# Patient Record
Sex: Male | Born: 1990 | Race: White | Hispanic: No | Marital: Single | Smoking: Current every day smoker
Health system: Southern US, Community
[De-identification: ages and names within clinical notes are randomized; demographics above are authoritative.]

## PROBLEM LIST (undated history)

## (undated) DIAGNOSIS — R569 Unspecified convulsions: Secondary | ICD-10-CM

## (undated) HISTORY — PX: EYE SURGERY: SHX253

---
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
## 2012-10-11 ENCOUNTER — Emergency Department: Payer: Self-pay | Admitting: Internal Medicine

## 2013-09-07 ENCOUNTER — Emergency Department: Payer: Self-pay | Admitting: Emergency Medicine

## 2014-07-26 ENCOUNTER — Encounter: Payer: Self-pay | Admitting: *Deleted

## 2014-07-26 ENCOUNTER — Emergency Department
Admission: EM | Admit: 2014-07-26 | Discharge: 2014-07-26 | Discharge status: Against Medical Advice | Payer: Medicaid Other | Attending: Emergency Medicine | Admitting: Emergency Medicine

## 2014-07-26 ENCOUNTER — Emergency Department: Payer: Medicaid Other

## 2014-07-26 DIAGNOSIS — Y92331 Roller skating rink as the place of occurrence of the external cause: Secondary | ICD-10-CM | POA: Diagnosis not present

## 2014-07-26 DIAGNOSIS — Y998 Other external cause status: Secondary | ICD-10-CM | POA: Insufficient documentation

## 2014-07-26 DIAGNOSIS — S70212A Abrasion, left hip, initial encounter: Secondary | ICD-10-CM | POA: Diagnosis not present

## 2014-07-26 DIAGNOSIS — Y9351 Activity, roller skating (inline) and skateboarding: Secondary | ICD-10-CM | POA: Diagnosis not present

## 2014-07-26 DIAGNOSIS — Z720 Tobacco use: Secondary | ICD-10-CM | POA: Insufficient documentation

## 2014-07-26 DIAGNOSIS — S79912A Unspecified injury of left hip, initial encounter: Secondary | ICD-10-CM | POA: Diagnosis present

## 2014-07-26 DIAGNOSIS — W1839XA Other fall on same level, initial encounter: Secondary | ICD-10-CM | POA: Insufficient documentation

## 2014-07-26 NOTE — ED Notes (Signed)
Pt left prior to  Being seen
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
## 2015-04-07 ENCOUNTER — Emergency Department
Admission: EM | Admit: 2015-04-07 | Discharge: 2015-04-07 | Disposition: A | Discharge status: Home / Self Care | Payer: Medicaid Other | Attending: Emergency Medicine | Admitting: Emergency Medicine

## 2015-04-07 DIAGNOSIS — F17200 Nicotine dependence, unspecified, uncomplicated: Secondary | ICD-10-CM | POA: Insufficient documentation

## 2015-04-07 DIAGNOSIS — H5712 Ocular pain, left eye: Secondary | ICD-10-CM | POA: Diagnosis not present

## 2015-04-07 DIAGNOSIS — Z9889 Other specified postprocedural states: Secondary | ICD-10-CM | POA: Insufficient documentation

## 2015-04-07 DIAGNOSIS — F1019 Alcohol abuse with unspecified alcohol-induced disorder: Secondary | ICD-10-CM | POA: Diagnosis not present

## 2015-04-07 MED ORDER — TRAMADOL HCL 50 MG PO TABS
50.0000 mg | ORAL_TABLET | Freq: Once | ORAL | Status: AC
  Administered 2015-04-07: 50 mg via ORAL
  Filled 2015-04-07: qty 1

## 2015-04-07 MED ORDER — HYDROXYZINE HCL 50 MG PO TABS: 50.0000 mg | ORAL_TABLET | Freq: Three times a day (TID) | ORAL | Status: AC | PRN

## 2015-04-07 MED ORDER — HYDROXYZINE HCL 50 MG PO TABS
50.0000 mg | ORAL_TABLET | Freq: Once | ORAL | Status: AC
  Administered 2015-04-07: 50 mg via ORAL
  Filled 2015-04-07: qty 1

## 2015-04-07 MED ORDER — TRAMADOL HCL 50 MG PO TABS: 50.0000 mg | ORAL_TABLET | Freq: Four times a day (QID) | ORAL | Status: DC | PRN

## 2015-04-07 NOTE — Discharge Instructions (Signed)
Advised to follow-up with [facility] tomorrow. Call that over 800 hours for appointment. Advised you are follow-up in the emergency room and he was previously seen by the clinic 4r years ago.

## 2015-04-07 NOTE — ED Notes (Signed)
20/25 left eye 20/25 right eye

## 2015-04-07 NOTE — ED Provider Notes (Signed)
[facility] Emergency Department Provider Note  ____________________________________________  Time seen: Approximately 4:25 PM  I have reviewed the triage vital signs and the nursing notes.   HISTORY  Chief Complaint Eye Pain    HPI Shane Griffith is a 25 y.o. male patient complaining of eye pain for 3 days. Patient also states that intermittent blurry vision to the left. 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 Patient said he was seen at the [facility] and referred to [facility] for surgery 4 years ago. Patient state he was told service on the partially successful and was advised follow-up if there is any change in his vision. Medical records from initial visit here are not available. This patient does show an MRI encounter for the orbits 4 years ago.Patient is rating his pain as a 6/10. No palliative measures taken for this complaint.  History reviewed. No pertinent past medical history.  There are no active problems to display for this patient.   Past Surgical History  Procedure Laterality Date  . Eye surgery      Current Outpatient Rx  Name  Route  Sig  Dispense  Refill  . hydrOXYzine (ATARAX/VISTARIL) 50 MG tablet   Oral   Take 1 tablet (50 mg total) by mouth 3 (three) times daily as needed for itching.   15 tablet   0   . traMADol (ULTRAM) 50 MG tablet   Oral   Take 1 tablet (50 mg total) by mouth every 6 (six) hours as needed for moderate pain.   12 tablet   0     Allergies Review of patient's allergies indicates no known allergies.  No family history on file.  Social History Social History  Substance Use Topics  . Smoking status: Current Every Day Smoker  . Smokeless tobacco: None  . Alcohol Use: Yes    Review of Systems Constitutional: No fever/chills Eyes: No visual changes. Pain and edema. ENT: No sore throat. Cardiovascular: Denies chest pain. Respiratory: Denies shortness of  breath. Gastrointestinal: No abdominal pain.  No nausea, no vomiting.  No diarrhea.  No constipation. Genitourinary: Negative for dysuria. Musculoskeletal: Negative for back pain. Skin: Negative for rash. Neurological: Negative for headaches, focal weakness or numbness. 10-point ROS otherwise negative.  ____________________________________________   PHYSICAL EXAM:  VITAL SIGNS: ED Triage Vitals  Enc Vitals Group     BP 04/07/15 1540 118/96 mmHg     Pulse Rate 04/07/15 1540 101     Resp 04/07/15 1540 16     Temp 04/07/15 1540 98.3 F (36.8 C)     Temp Source 04/07/15 1540 Oral     SpO2 04/07/15 1540 98 %     Weight 04/07/15 1540 175 lb (79.379 kg)     Height 04/07/15 1540  (1.854 m)     Head Cir --      Peak Flow --      Pain Score 04/07/15 1540 6     Pain Loc --      Pain Edu? --      Excl. in GC? --     Constitutional: Alert and oriented. Well appearing and in no acute distress. Eyes: Conjunctivae are normal. PERRL. EOMI. visual acuity is 20/25 bilaterally. Patient has some left inferior orbital edema. Head: Atraumatic. Nose: No congestion/rhinnorhea. Mouth/Throat: Mucous membranes are moist.  Oropharynx non-erythematous. Neck: No stridor. No cervical spine tenderness to palpation. Hematological/Lymphatic/Immunilogical: No cervical lymphadenopathy. Cardiovascular: Normal rate, regular rhythm.  Grossly normal heart sounds.  Good peripheral circulation. Respiratory: Normal respiratory effort.  No retractions. Lungs CTAB. Gastrointestinal: Soft and nontender. No distention. No abdominal bruits. No CVA tenderness. Musculoskeletal: No lower extremity tenderness nor edema.  No joint effusions. Neurologic:  Normal speech and language. No gross focal neurologic deficits are appreciated. No gait instability. Skin:  Skin is warm, dry and intact. No rash noted. Psychiatric: Mood and affect are normal. Speech and behavior are  normal.  ____________________________________________   LABS (all labs ordered are listed, but only abnormal results are displayed)  Labs Reviewed - No data to display ____________________________________________  EKG   ____________________________________________  RADIOLOGY   ____________________________________________   PROCEDURES  Procedure(s) performed: None  Critical Care performed: No  ____________________________________________   INITIAL IMPRESSION / ASSESSMENT AND PLAN / ED COURSE  Pertinent labs & imaging results that were available during my care of the patient were reviewed by me and considered in my medical decision making (see chart for details).  Left eye pain. Advised to follow-up with [facility] by calling for an appointment tomorrow morning. Prescription given for Atarax and tramadol. ____________________________________________   FINAL CLINICAL IMPRESSION(S) / ED DIAGNOSES  Final diagnoses:  Pain around eye, left      Joni ReiningRonald K Arin Vanosdol, PA-C 04/07/15 1646  Emily FilbertJonathan E Williams, MD 04/07/15 1721

## 2015-04-07 NOTE — ED Notes (Signed)
Pt arrives to ER c/o left eye pain X 3 days. 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 Pt alert and oriented X4, active, cooperative, pt in NAD. RR even and unlabored, color WNL.  Pt reports blurry vision to left eye intermittently.

## 2015-06-15 ENCOUNTER — Encounter: Payer: Self-pay | Admitting: Emergency Medicine

## 2015-06-15 ENCOUNTER — Emergency Department
Admission: EM | Admit: 2015-06-15 | Discharge: 2015-06-15 | Disposition: A | Discharge status: Home / Self Care | Payer: Medicaid Other | Attending: Emergency Medicine | Admitting: Emergency Medicine

## 2015-06-15 DIAGNOSIS — F17210 Nicotine dependence, cigarettes, uncomplicated: Secondary | ICD-10-CM | POA: Insufficient documentation

## 2015-06-15 DIAGNOSIS — R4182 Altered mental status, unspecified: Secondary | ICD-10-CM | POA: Diagnosis present

## 2015-06-15 DIAGNOSIS — R456 Violent behavior: Secondary | ICD-10-CM

## 2015-06-15 LAB — CBC WITH DIFFERENTIAL/PLATELET
Basophils Absolute: 0 10*3/uL (ref 0–0.1)
EOS ABS: 0.1 10*3/uL (ref 0–0.7)
Eosinophils Relative: 1 %
HEMATOCRIT: 40.8 % (ref 40.0–52.0)
HEMOGLOBIN: 13.8 g/dL (ref 13.0–18.0)
LYMPHS ABS: 1.2 10*3/uL (ref 1.0–3.6)
Lymphocytes Relative: 9 %
MCH: 29.1 pg (ref 26.0–34.0)
MCHC: 33.9 g/dL (ref 32.0–36.0)
MCV: 86 fL (ref 80.0–100.0)
Monocytes Absolute: 1.1 10*3/uL — ABNORMAL HIGH (ref 0.2–1.0)
Monocytes Relative: 9 %
NEUTROS ABS: 11 10*3/uL — AB (ref 1.4–6.5)
Platelets: 233 10*3/uL (ref 150–440)
RBC: 4.74 MIL/uL (ref 4.40–5.90)
RDW: 13.2 % (ref 11.5–14.5)
WBC: 13.4 10*3/uL — AB (ref 3.8–10.6)

## 2015-06-15 LAB — URINALYSIS COMPLETE WITH MICROSCOPIC (ARMC ONLY)
BILIRUBIN URINE: NEGATIVE
Glucose, UA: NEGATIVE mg/dL
Ketones, ur: NEGATIVE mg/dL
LEUKOCYTES UA: NEGATIVE
NITRITE: NEGATIVE
Protein, ur: 30 mg/dL — AB
SPECIFIC GRAVITY, URINE: 1.012 (ref 1.005–1.030)
Squamous Epithelial / LPF: NONE SEEN
pH: 5 (ref 5.0–8.0)

## 2015-06-15 LAB — URINE DRUG SCREEN, QUALITATIVE (ARMC ONLY)
Amphetamines, Ur Screen: NOT DETECTED
Barbiturates, Ur Screen: NOT DETECTED
Benzodiazepine, Ur Scrn: POSITIVE — AB
CANNABINOID 50 NG, UR ~~LOC~~: POSITIVE — AB
COCAINE METABOLITE, UR ~~LOC~~: POSITIVE — AB
MDMA (ECSTASY) UR SCREEN: NOT DETECTED
Methadone Scn, Ur: NOT DETECTED
OPIATE, UR SCREEN: NOT DETECTED
Phencyclidine (PCP) Ur S: NOT DETECTED
Tricyclic, Ur Screen: NOT DETECTED

## 2015-06-15 LAB — COMPREHENSIVE METABOLIC PANEL
ALK PHOS: 76 U/L (ref 38–126)
ALT: 15 U/L — ABNORMAL LOW (ref 17–63)
ANION GAP: 7 (ref 5–15)
AST: 24 U/L (ref 15–41)
Albumin: 4.4 g/dL (ref 3.5–5.0)
BUN: 7 mg/dL (ref 6–20)
CALCIUM: 9.3 mg/dL (ref 8.9–10.3)
CO2: 25 mmol/L (ref 22–32)
Chloride: 109 mmol/L (ref 101–111)
Creatinine, Ser: 1.1 mg/dL (ref 0.61–1.24)
GFR calc non Af Amer: >60 mL/min (ref >60)
Glucose, Bld: 99 mg/dL (ref 65–99)
Potassium: 3.5 mmol/L (ref 3.5–5.1)
SODIUM: 141 mmol/L (ref 135–145)
Total Bilirubin: 0.5 mg/dL (ref 0.3–1.2)
Total Protein: 6.9 g/dL (ref 6.5–8.1)

## 2015-06-15 LAB — ETHANOL: Alcohol, Ethyl (B): <5 mg/dL (ref <5)

## 2015-06-15 LAB — ACETAMINOPHEN LEVEL

## 2015-06-15 LAB — SALICYLATE LEVEL: Salicylate Lvl: <4 mg/dL (ref 2.8–30.0)

## 2015-06-15 MED ORDER — HALOPERIDOL LACTATE 5 MG/ML IJ SOLN
5.0000 mg | Freq: Once | INTRAMUSCULAR | Status: AC
  Administered 2015-06-15: 5 mg via INTRAMUSCULAR

## 2015-06-15 MED ORDER — HALOPERIDOL LACTATE 5 MG/ML IJ SOLN
INTRAMUSCULAR | Status: AC
  Administered 2015-06-15: 5 mg via INTRAMUSCULAR
  Filled 2015-06-15: qty 1

## 2015-06-15 MED ORDER — LORAZEPAM 2 MG/ML IJ SOLN
INTRAMUSCULAR | Status: AC
  Administered 2015-06-15: 4 mg via INTRAMUSCULAR
  Filled 2015-06-15: qty 1

## 2015-06-15 MED ORDER — LORAZEPAM 2 MG/ML IJ SOLN
INTRAMUSCULAR | Status: AC
  Filled 2015-06-15: qty 1

## 2015-06-15 MED ORDER — LORAZEPAM 2 MG/ML IJ SOLN
4.0000 mg | Freq: Once | INTRAMUSCULAR | Status: AC
  Administered 2015-06-15: 4 mg via INTRAMUSCULAR

## 2015-06-15 NOTE — ED Notes (Signed)
Pt easily arousable and calm. Dr will re-evaluate for discharge.
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
## 2015-06-15 NOTE — ED Provider Notes (Signed)
-----------------------------------------   10:44 PM on 06/15/2015 ----------------------------------------- The patient awakens easily from sleep, is oriented, ambulates well, moves all extremities equally. His vital signs are stable, he is suitable for discharge into police custody.  Gayla DossEryka A Grantland Want, MD 06/15/15 2244
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
## 2015-06-15 NOTE — ED Notes (Signed)
Pt sleeping soundly but easily arousable. vss

## 2015-06-15 NOTE — ED Notes (Signed)
Pt placed on o2 monitor d/t being medicated.

## 2015-06-16 ENCOUNTER — Emergency Department: Payer: Medicaid Other

## 2015-06-16 ENCOUNTER — Encounter: Payer: Self-pay | Admitting: Emergency Medicine

## 2015-06-16 ENCOUNTER — Emergency Department
Admission: EM | Admit: 2015-06-16 | Discharge: 2015-06-16 | Disposition: A | Discharge status: Home / Self Care | Payer: Medicaid Other | Attending: Emergency Medicine | Admitting: Emergency Medicine

## 2015-06-16 DIAGNOSIS — W2201XA Walked into wall, initial encounter: Secondary | ICD-10-CM | POA: Insufficient documentation

## 2015-06-16 DIAGNOSIS — S0990XA Unspecified injury of head, initial encounter: Secondary | ICD-10-CM | POA: Diagnosis not present

## 2015-06-16 DIAGNOSIS — S01112A Laceration without foreign body of left eyelid and periocular area, initial encounter: Secondary | ICD-10-CM | POA: Diagnosis not present

## 2015-06-16 DIAGNOSIS — Y92149 Unspecified place in prison as the place of occurrence of the external cause: Secondary | ICD-10-CM | POA: Diagnosis not present

## 2015-06-16 DIAGNOSIS — F17210 Nicotine dependence, cigarettes, uncomplicated: Secondary | ICD-10-CM | POA: Diagnosis not present

## 2015-06-16 DIAGNOSIS — Y939 Activity, unspecified: Secondary | ICD-10-CM | POA: Diagnosis not present

## 2015-06-16 DIAGNOSIS — Y999 Unspecified external cause status: Secondary | ICD-10-CM | POA: Insufficient documentation
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
## 2015-06-16 NOTE — Discharge Instructions (Signed)
Facial Laceration ° A facial laceration is a cut on the face. These injuries can be painful and cause bleeding. Lacerations usually heal quickly, but they need special care to reduce scarring. °DIAGNOSIS  °Your health care provider will take a medical history, ask for details about how the injury occurred, and examine the wound to determine how deep the cut is. °TREATMENT  °Some facial lacerations may not require closure. Others may not be able to be closed because of an increased risk of infection. The risk of infection and the chance for successful closure will depend on various factors, including the amount of time since the injury occurred. °The wound may be cleaned to help prevent infection. If closure is appropriate, pain medicines may be given if needed. Your health care provider will use stitches (sutures), wound glue (adhesive), or skin adhesive strips to repair the laceration. These tools bring the skin edges together to allow for faster healing and a better cosmetic outcome. If needed, you may also be given a tetanus shot. °HOME CARE INSTRUCTIONS °· Only take over-the-counter or prescription medicines as directed by your health care provider. °· Follow your health care provider's instructions for wound care. These instructions will vary depending on the technique used for closing the wound. °For Sutures: °· Keep the wound clean and dry.   °· If you were given a bandage (dressing), you should change it at least once a day. Also change the dressing if it becomes wet or dirty, or as directed by your health care provider.   °· Wash the wound with soap and water 2 times a day. Rinse the wound off with water to remove all soap. Pat the wound dry with a clean towel.   °· After cleaning, apply a thin layer of the antibiotic ointment recommended by your health care provider. This will help prevent infection and keep the dressing from sticking.   °· You may shower as usual after the first 24 hours. Do not soak the  wound in water until the sutures are removed.   °· Get your sutures removed as directed by your health care provider. With facial lacerations, sutures should usually be taken out after 4-5 days to avoid stitch marks.   °· Wait a few days after your sutures are removed before applying any makeup. °For Skin Adhesive Strips: °· Keep the wound clean and dry.   °· Do not get the skin adhesive strips wet. You may bathe carefully, using caution to keep the wound dry.   °· If the wound gets wet, pat it dry with a clean towel.   °· Skin adhesive strips will fall off on their own. You may trim the strips as the wound heals. Do not remove skin adhesive strips that are still stuck to the wound. They will fall off in time.   °For Wound Adhesive: °· You may briefly wet your wound in the shower or bath. Do not soak or scrub the wound. Do not swim. Avoid periods of heavy sweating until the skin adhesive has fallen off on its own. After showering or bathing, gently pat the wound dry with a clean towel.   °· Do not apply liquid medicine, cream medicine, ointment medicine, or makeup to your wound while the skin adhesive is in place. This may loosen the film before your wound is healed.   °· If a dressing is placed over the wound, be careful not to apply tape directly over the skin adhesive. This may cause the adhesive to be pulled off before the wound is healed.   °· Avoid   prolonged exposure to sunlight or tanning lamps while the skin adhesive is in place.  The skin adhesive will usually remain in place for 5-10 days, then naturally fall off the skin. Do not pick at the adhesive film.  After Healing: Once the wound has healed, cover the wound with sunscreen during the day for 1 full year. This can help minimize scarring. Exposure to ultraviolet light in the first year will darken the scar. It can take 1-2 years for the scar to lose its redness and to heal completely.  SEEK MEDICAL CARE IF:  You have a fever. SEEK IMMEDIATE  MEDICAL CARE IF:  You have redness, pain, or swelling around the wound.   You see ayellowish-white fluid (pus) coming from the wound.    This information is not intended to replace advice given to you by your health care provider. Make sure you discuss any questions you have with your health care provider.   Document Released: 02/20/2004 Document Revised: 02/02/2014 Document Reviewed: 08/25/2012 Elsevier Interactive Patient Education 2016 [employer]  Wound Care Taking care of your wound properly can help to prevent pain and infection. It can also help your wound to heal more quickly.  HOW TO CARE FOR YOUR WOUND  Take or apply over-the-counter and prescription medicines only as told by your health care provider.  If you were prescribed antibiotic medicine, take or apply it as told by your health care provider. Do not stop using the antibiotic even if your condition improves.  Clean the wound each day or as told by your health care provider.  Wash the wound with mild soap and water.  Rinse the wound with water to remove all soap.  Pat the wound dry with a clean towel. Do not rub it.  There are many different ways to close and cover a wound. For example, a wound can be covered with stitches (sutures), skin glue, or adhesive strips. Follow instructions from your health care provider about:  How to take care of your wound.  When and how you should change your bandage (dressing).  When you should remove your dressing.  Removing whatever was used to close your wound.  Check your wound every day for signs of infection. Watch for:  Redness, swelling, or pain.  Fluid, blood, or pus.  Keep the dressing dry until your health care provider says it can be removed. Do not take baths, swim, use a hot tub, or do anything that would put your wound underwater until your health care provider approves.  Raise (elevate) the injured area above the level of your heart while you are  sitting or lying down.  Do not scratch or pick at the wound.  Keep all follow-up visits as told by your health care provider. This is important. SEEK MEDICAL CARE IF:  You received a tetanus shot and you have swelling, severe pain, redness, or bleeding at the injection site.  You have a fever.  Your pain is not controlled with medicine.  You have increased redness, swelling, or pain at the site of your wound.  You have fluid, blood, or pus coming from your wound.  You notice a bad smell coming from your wound or your dressing. SEEK IMMEDIATE MEDICAL CARE IF:  You have a red streak going away from your wound.   This information is not intended to replace advice given to you by your health care provider. Make sure you discuss any questions you have with your health care provider.  Document Released: 10/22/2007 Document Revised: 05/29/2014 Document Reviewed: 01/08/2014 Elsevier Interactive Patient Education 2016 Else[employer] Inc.  Head Injury, Adult You have a head injury. Headaches and throwing up (vomiting) are common after a head injury. It should be easy to wake up from sleeping. Sometimes you must stay in the hospital. Most problems happen within the first 24 hours. Side effects may occur up to 7-10 days after the injury.  WHAT ARE THE TYPES OF HEAD INJURIES? Head injuries can be as minor as a bump. Some head injuries can be more severe. More severe head injuries include:  A jarring injury to the brain (concussion).  A bruise of the brain (contusion). This mean there is bleeding in the brain that can cause swelling.  A cracked skull (skull fracture).  Bleeding in the brain that collects, clots, and forms a bump (hematoma). WHEN SHOULD I GET HELP RIGHT AWAY?   You are confused or sleepy.  You cannot be woken up.  You feel sick to your stomach (nauseous) or keep throwing up (vomiting).  Your dizziness or unsteadiness is getting worse.  You have very bad, lasting  headaches that are not helped by medicine. Take medicines only as told by your doctor.  You cannot use your arms or legs like normal.  You cannot walk.  You notice changes in the black spots in the center of the colored part of your eye (pupil).  You have clear or bloody fluid coming from your nose or ears.  You have trouble seeing. During the next 24 hours after the injury, you must stay with someone who can watch you. This person should get help right away (call 911 in the [place] if you start to shake and are not able to control it (have seizures), you pass out, or you are unable to wake up. HOW CAN I PREVENT A HEAD INJURY IN THE FUTURE?  Wear seat belts.  Wear a helmet while bike riding and playing sports like football.  Stay away from dangerous activities around the house. WHEN CAN I RETURN TO NORMAL ACTIVITIES AND ATHLETICS? See your doctor before doing these activities. You should not do normal activities or play contact sports until 1 week after the following symptoms have stopped:  Headache that does not go away.  Dizziness.  Poor attention.  Confusion.  Memory problems.  Sickness to your stomach or throwing up.  Tiredness.  Fussiness.  Bothered by bright lights or loud noises.  Anxiousness or depression.  Restless sleep. MAKE SURE YOU:   Understand these instructions.  Will watch your condition.  Will get help right away if you are not doing well or get worse.   This information is not intended to replace advice given to you by your health care provider. Make sure you discuss any questions you have with your health care provider.   Document Released: 12/26/2007 Document Revised: 02/02/2014 Document Reviewed: 09/19/2012 Elsevier Interactive Patient Education [employer] Inc[employer]
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
## 2016-04-21 ENCOUNTER — Encounter: Payer: Self-pay | Admitting: Emergency Medicine

## 2016-04-21 ENCOUNTER — Emergency Department
Admission: EM | Admit: 2016-04-21 | Discharge: 2016-04-22 | Disposition: A | Discharge status: Home / Self Care | Payer: Medicaid Other | Attending: Student in an Organized Health Care Education/Training Program | Admitting: Student in an Organized Health Care Education/Training Program

## 2016-04-21 DIAGNOSIS — F17210 Nicotine dependence, cigarettes, uncomplicated: Secondary | ICD-10-CM | POA: Diagnosis not present

## 2016-04-21 DIAGNOSIS — J111 Influenza due to unidentified influenza virus with other respiratory manifestations: Secondary | ICD-10-CM | POA: Insufficient documentation

## 2016-04-21 DIAGNOSIS — R509 Fever, unspecified: Secondary | ICD-10-CM | POA: Diagnosis present

## 2016-04-21 MED ORDER — OSELTAMIVIR PHOSPHATE 75 MG PO CAPS: 75.0000 mg | ORAL_CAPSULE | Freq: Two times a day (BID) | ORAL | 0 refills | Status: AC

## 2016-04-21 MED ORDER — SODIUM CHLORIDE 0.9 % IV BOLUS (SEPSIS)
1000.0000 mL | Freq: Once | INTRAVENOUS | Status: AC
  Administered 2016-04-21: 1000 mL via INTRAVENOUS

## 2016-04-21 MED ORDER — OSELTAMIVIR PHOSPHATE 75 MG PO CAPS
75.0000 mg | ORAL_CAPSULE | Freq: Once | ORAL | Status: AC
  Administered 2016-04-21: 75 mg via ORAL
  Filled 2016-04-21: qty 1

## 2016-04-21 MED ORDER — FLUTICASONE PROPIONATE 50 MCG/ACT NA SUSP: 2.0000 | Freq: Every day | NASAL | 0 refills | Status: DC

## 2016-04-21 MED ORDER — IBUPROFEN 600 MG PO TABS
600.0000 mg | ORAL_TABLET | Freq: Once | ORAL | Status: AC
  Administered 2016-04-21: 600 mg via ORAL
  Filled 2016-04-21: qty 1

## 2016-04-21 NOTE — ED Provider Notes (Signed)
[facility] Emergency Department Provider Note  ____________________________________________  Time seen: Approximately 11:10 PM  I have reviewed the triage vital signs and the nursing notes.   HISTORY  Chief Complaint Nasal Congestion and Fever    HPI Shane Griffith is a 26 y.o. male that presents to the emergency department withfever, headache, muscle aches, chills, congestion, nonproductive cough for 2 days. Patient is drinking well but eating less. No change in urination. He had an episode of diarrhea today. Patient knows people that that have had the flu. Patient denies shortness of breath, chest pain, nausea, vomiting, abdominal pain, constipation.   History reviewed. No pertinent past medical history.  There are no active problems to display for this patient.   Past Surgical History:  Procedure Laterality Date  . EYE SURGERY      Prior to Admission medications   Medication Sig Start Date End Date Taking? Authorizing Provider  fluticasone (FLONASE) 50 MCG/ACT nasal spray Place 2 sprays into both nostrils daily. 04/21/16 04/21/17  Enid DerryAshley Filip Luten, PA-C  hydrOXYzine (ATARAX/VISTARIL) 50 MG tablet Take 1 tablet (50 mg total) by mouth 3 (three) times daily as needed for itching. 04/07/15   Joni Reiningonald K Smith, PA-C  oseltamivir (TAMIFLU) 75 MG capsule Take 1 capsule (75 mg total) by mouth 2 (two) times daily. 04/21/16 04/26/16  Enid DerryAshley Kate Larock, PA-C  traMADol (ULTRAM) 50 MG tablet Take 1 tablet (50 mg total) by mouth every 6 (six) hours as needed for moderate pain. 04/07/15   Joni Reiningonald K Smith, PA-C    Allergies Patient has no known allergies.  No family history on file.  Social History Social History  Substance Use Topics  . Smoking status: Current Every Day Smoker    Types: Cigarettes  . Smokeless tobacco: Never Used  . Alcohol use Yes     Review of Systems  Constitutional: Positive for fever/chills Eyes: No visual changes. No discharge. ENT: Positive  for congestion and rhinorrhea. Cardiovascular: No chest pain. Respiratory: Positive for cough. No SOB. Gastrointestinal: No abdominal pain.  No nausea, no vomiting.  No diarrhea.  No constipation. Musculoskeletal: Positive for musculoskeletal pain. Skin: Negative for rash, abrasions, lacerations, ecchymosis. Neurological: Positive for headaches.   ____________________________________________   PHYSICAL EXAM:  VITAL SIGNS: ED Triage Vitals  Enc Vitals Group     BP 04/21/16 2007 117/66     Pulse Rate 04/21/16 2007 (!) 118     Resp 04/21/16 2007 18     Temp 04/21/16 2007 98.9 F (37.2 C)     Temp Source 04/21/16 2007 Oral     SpO2 04/21/16 2007 97 %     Weight 04/21/16 2007 155 lb (70.3 kg)     Height 04/21/16 2007 6\' 2"  (1.88 m)     Head Circumference --      Peak Flow --      Pain Score 04/21/16 2006 5     Pain Loc --      Pain Edu? --      Excl. in GC? --      Constitutional: Alert and oriented. Well appearing and in no acute distress. Eyes: Conjunctivae are normal. PERRL. EOMI. No discharge. Head: Atraumatic. ENT: No frontal and maxillary sinus tenderness.      Ears: Tympanic membranes pearly gray with good landmarks. No discharge.      Nose: Mild congestion/rhinnorhea.      Mouth/Throat: Mucous membranes are moist. Oropharynx non-erythematous. Tonsils not enlarged. No exudates. Uvula midline. Neck: No stridor.   Hematological/Lymphatic/Immunilogical:  No cervical lymphadenopathy. Cardiovascular: Normal rate, regular rhythm.  Good peripheral circulation. Respiratory: Normal respiratory effort without tachypnea or retractions. Lungs CTAB. Good air entry to the bases with no decreased or absent breath sounds. Gastrointestinal: Bowel sounds 4 quadrants. Soft and nontender to palpation. No guarding or rigidity. No palpable masses. No distention. Musculoskeletal: Full range of motion to all extremities. No gross deformities appreciated. Neurologic:  Normal speech and  language. No gross focal neurologic deficits are appreciated.  Skin:  Skin is warm, dry and intact. No rash noted.    ____________________________________________   LABS (all labs ordered are listed, but only abnormal results are displayed)  Labs Reviewed - No data to display ____________________________________________  EKG   ____________________________________________  RADIOLOGY   No results found.  ____________________________________________    PROCEDURES  Procedure(s) performed:    Procedures    Medications  sodium chloride 0.9 % bolus 1,000 mL (0 mLs Intravenous Stopped 04/22/16 0000)  oseltamivir (TAMIFLU) capsule 75 mg (75 mg Oral Given 04/21/16 2248)  ibuprofen (ADVIL,MOTRIN) tablet 600 mg (600 mg Oral Given 04/21/16 2249)     ____________________________________________   INITIAL IMPRESSION / ASSESSMENT AND PLAN / ED COURSE  Pertinent labs & imaging results that were available during my care of the patient were reviewed by me and considered in my medical decision making (see chart for details).  Review of the [place] CSRS was performed in accordance of the [facility] prior to dispensing any controlled drugs.     Patient's diagnosis is consistent with Influenza. Vital signs and exam are reassuring. Patient received IV fluids in ED. Patient received ibuprofen and dose of Tamiflu in ED. Patient should alternate tylenol and ibuprofen for fever. Patient feels comfortable going home. Patient will be discharged home with prescriptions for Tamiflu. Patient is to follow up with PCP as needed or otherwise directed. Patient is given ED precautions to return to the ED for any worsening or new symptoms.     ____________________________________________  FINAL CLINICAL IMPRESSION(S) / ED DIAGNOSES  Final diagnoses:  Influenza      NEW MEDICATIONS STARTED DURING THIS VISIT:  Discharge Medication List as of 04/21/2016 11:37 PM    START taking these medications    Details  fluticasone (FLONASE) 50 MCG/ACT nasal spray Place 2 sprays into both nostrils daily., Starting Tue 04/21/2016, Until Wed 04/21/2017, Print    oseltamivir (TAMIFLU) 75 MG capsule Take 1 capsule (75 mg total) by mouth 2 (two) times daily., Starting Tue 04/21/2016, Until Sun 04/26/2016, Print            This chart was dictated using voice recognition software/Dragon. Despite best efforts to proofread, errors can occur which can change the meaning. Any change was purely unintentional.    Enid Derry, PA-C 04/22/16 0012    Willy Eddy, MD 04/22/16 [phone]

## 2016-04-21 NOTE — ED Notes (Signed)
Pt ambulatory to treatment room. States runny nose, fever (didn't take temp at home, just states chills), and body aches x 2 days. Alert, oriented, no distress noted.

## 2016-04-21 NOTE — ED Triage Notes (Signed)
Patient ambulatory to triage with steady gait, without difficulty or distress noted; pt reports runny nose, sore throat & fever since yesterday with body aches

## 2016-04-22 NOTE — ED Notes (Signed)
Discontinued the IV from the patients left AC.

## 2016-05-28 ENCOUNTER — Emergency Department (HOSPITAL_COMMUNITY)
Admission: EM | Admit: 2016-05-28 | Discharge: 2016-05-28 | Disposition: A | Discharge status: Home / Self Care | Payer: Medicaid Other | Attending: Emergency Medicine | Admitting: Emergency Medicine

## 2016-05-28 ENCOUNTER — Emergency Department (HOSPITAL_COMMUNITY): Payer: Medicaid Other

## 2016-05-28 ENCOUNTER — Emergency Department
Admission: EM | Admit: 2016-05-28 | Discharge: 2016-05-28 | Disposition: A | Discharge status: Home / Self Care | Payer: Medicaid Other | Attending: Emergency Medicine | Admitting: Emergency Medicine

## 2016-05-28 ENCOUNTER — Encounter (HOSPITAL_COMMUNITY): Payer: Self-pay | Admitting: Emergency Medicine

## 2016-05-28 ENCOUNTER — Encounter: Payer: Self-pay | Admitting: Emergency Medicine

## 2016-05-28 DIAGNOSIS — W268XXA Contact with other sharp object(s), not elsewhere classified, initial encounter: Secondary | ICD-10-CM | POA: Insufficient documentation

## 2016-05-28 DIAGNOSIS — F17210 Nicotine dependence, cigarettes, uncomplicated: Secondary | ICD-10-CM | POA: Insufficient documentation

## 2016-05-28 DIAGNOSIS — S61211A Laceration without foreign body of left index finger without damage to nail, initial encounter: Secondary | ICD-10-CM | POA: Insufficient documentation

## 2016-05-28 DIAGNOSIS — Y929 Unspecified place or not applicable: Secondary | ICD-10-CM | POA: Insufficient documentation

## 2016-05-28 DIAGNOSIS — Z79899 Other long term (current) drug therapy: Secondary | ICD-10-CM | POA: Diagnosis not present

## 2016-05-28 DIAGNOSIS — Y999 Unspecified external cause status: Secondary | ICD-10-CM | POA: Insufficient documentation

## 2016-05-28 DIAGNOSIS — Y939 Activity, unspecified: Secondary | ICD-10-CM | POA: Insufficient documentation

## 2016-05-28 MED ORDER — HYDROCODONE-ACETAMINOPHEN 5-325 MG PO TABS: 1.0000 | ORAL_TABLET | Freq: Four times a day (QID) | ORAL | 0 refills | Status: DC | PRN

## 2016-05-28 MED ORDER — LIDOCAINE HCL (PF) 1 % IJ SOLN
5.0000 mL | Freq: Once | INTRAMUSCULAR | Status: AC
  Administered 2016-05-28: 5 mL
  Filled 2016-05-28: qty 30

## 2016-05-28 MED ORDER — CEPHALEXIN 500 MG PO CAPS: 500.0000 mg | ORAL_CAPSULE | Freq: Four times a day (QID) | ORAL | 0 refills | Status: AC

## 2016-05-28 MED ORDER — OXYCODONE-ACETAMINOPHEN 5-325 MG PO TABS
1.0000 | ORAL_TABLET | Freq: Once | ORAL | Status: AC
  Administered 2016-05-28: 1 via ORAL
  Filled 2016-05-28: qty 1
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
## 2016-05-28 NOTE — ED Provider Notes (Signed)
[facility]-EMERGENCY DEPT Provider Note   CSN: [id number] Arrival date & time: 05/28/16  1115  By signing my name below, I, Cynda Acres, attest that this documentation has been prepared under the direction and in the presence of [facility], PA-C. Electronically Signed: Cynda Acres, Scribe. 05/28/16. 12:37 AM.   History   Chief Complaint Chief Complaint  Patient presents with  . Extremity Laceration    HPI Comments: Shane Griffith is a 26 y.o. male with no pertinent past medical history, who presents to the Emergency Department complaining of a sudden-onset laceration to the left index finger that happened at 10:30 this morning. Patient states he was moving a piece of scrap metal today, when he cut his left index finger. Patient states it is possible that he has a piece of metal in the laceration. Patient reports associated pain and tingling. Patient reports applying pressure with gauze. Bleeding is controlled. Last tetanus was 5 years ago. Patient is right hand dominant. Patient denies any numbness, tingling, weakness, other injury.   The history is provided by the patient. No language interpreter was used.    History reviewed. No pertinent past medical history.  There are no active problems to display for this patient.   Past Surgical History:  Procedure Laterality Date  . EYE SURGERY         Home Medications    Prior to Admission medications   Medication Sig Start Date End Date Taking? Authorizing Provider  fluticasone (FLONASE) 50 MCG/ACT nasal spray Place 2 sprays into both nostrils daily. 04/21/16 04/21/17  Enid Derry, PA-C  hydrOXYzine (ATARAX/VISTARIL) 50 MG tablet Take 1 tablet (50 mg total) by mouth 3 (three) times daily as needed for itching. 04/07/15   Joni Reining, PA-C  traMADol (ULTRAM) 50 MG tablet Take 1 tablet (50 mg total) by mouth every 6 (six) hours as needed for moderate pain. 04/07/15   Joni Reining, PA-C    Family History History reviewed. No  pertinent family history.  Social History Social History  Substance Use Topics  . Smoking status: Current Every Day Smoker    Types: Cigarettes  . Smokeless tobacco: Never Used  . Alcohol use Yes     Allergies   Patient has no known allergies.   Review of Systems Review of Systems  Constitutional: Negative for chills and fever.  Musculoskeletal: Negative for arthralgias.  Skin: Positive for wound. Negative for color change and pallor.       Left index finger laceration.   Neurological: Negative for weakness and numbness.  Hematological: Does not bruise/bleed easily.     Physical Exam Updated Vital Signs BP 124/83 (BP Location: Right Arm)   Pulse 94   Temp 98.1 F (36.7 C) (Oral)   Resp 16   SpO2 100%   Physical Exam  Constitutional: He appears well-developed and well-nourished. No distress.  HENT:  Head: Normocephalic and atraumatic.  Neck: Neck supple.  Pulmonary/Chest: Effort normal.  Musculoskeletal: He exhibits no tenderness.  Left index finger with laceration over the palmar surface of the distal phalanx. Hemostatic. Full active range of motion of all digits, strength 5/5, sensation intact, capillary refill < 2 seconds.      Neurological: He is alert.  Skin: He is not diaphoretic.  Nursing note and vitals reviewed.    ED Treatments / Results  DIAGNOSTIC STUDIES: Oxygen Saturation is 100% on RA, normal by my interpretation.    COORDINATION OF CARE: 1:03 PM Discussed treatment plan with pt at bedside and  pt agreed to plan, which includes a digital block.   Labs (all labs ordered are listed, but only abnormal results are displayed) Labs Reviewed - No data to display  EKG  EKG Interpretation None       Radiology Dg Finger Index Left  Result Date: 05/28/2016 CLINICAL DATA:  Laceration. EXAM: LEFT INDEX FINGER 2+V COMPARISON:  No recent prior. FINDINGS: Soft tissue laceration distal aspect of the left index finger. Tiny radiopaque densities  suggesting foreign body noted over the surface of the laceration, no fracture or dislocation. IMPRESSION: Soft-tissue less or distal aspect left index finger. Tiny punctate radiopaque foreign body noted over the surface of the laceration. No acute bony abnormality. No fracture. Electronically Signed   By: Maisie Fushomas  Register   On: 05/28/2016 12:44    Procedures Procedures (including critical care time)   LACERATION REPAIR Performed by: Trixie DredgeEmily Reyli Schroth, PA-C Consent: Verbal consent obtained. Risks and benefits: risks, benefits and alternatives were discussed Patient identity confirmed: provided demographic data Time out performed prior to procedure Prepped and Draped in normal sterile fashion Wound explored Laceration Location: left index finger  Laceration Length: 1.5 cm No Foreign Bodies seen or palpated - small surface FB - metal vs dirt removed at the area indicated on xray. Anesthesia: digital block Local anesthetic: lidocaine 1% without epinephrine Anesthetic total: 5cc Irrigation method: syringe Amount of cleaning: standard Skin closure: 5-0 vicryl  Number of sutures or staples: 6 Technique: simple interrupted  Patient tolerance: Patient tolerated the procedure well with no immediate complications.  Medications Ordered in ED Medications  lidocaine (PF) (XYLOCAINE) 1 % injection 5 mL (5 mLs Infiltration Given by Other 05/28/16 1233)     Initial Impression / Assessment and Plan / ED Course  I have reviewed the triage vital signs and the nursing notes.  Pertinent labs & imaging results that were available during my care of the patient were reviewed by me and considered in my medical decision making (see chart for details).     Afebrile, nontoxic patient with laceration to left index finger without complication, without tendon involvement.  Repaired in ED.   D/C home with wound care instructions, return precautions.  Discussed result, findings, treatment, and follow up  with patient.   Pt given return precautions.  Pt verbalizes understanding and agrees with plan.       Final Clinical Impressions(s) / ED Diagnoses   Final diagnoses:  Laceration of left index finger without damage to nail, foreign body presence unspecified, initial encounter    New Prescriptions Discharge Medication List as of 05/28/2016  2:00 PM     I personally performed the services described in this documentation, which was scribed in my presence. The recorded information has been reviewed and is accurate.    Trixie Dredgemily Wyolene Weimann, PA-C 05/28/16 1649    Nira Connardama, Pedro Eduardo, MD 05/29/16 1900

## 2016-05-28 NOTE — ED Triage Notes (Signed)
Patient reports moving scrap metal today and cutting his left index finger. Approx 1 inch laceration noted. Bleeding controlled. Ambulatory to triage.

## 2016-05-28 NOTE — Discharge Instructions (Signed)
Read the information below.  You may return to the Emergency Department at any time for worsening condition or any new symptoms that concern you.  If you develop redness, swelling, pus draining from the wound, or fevers greater than 100.4, return to the ER immediately for a recheck.   °

## 2016-05-28 NOTE — ED Triage Notes (Signed)
Pt to ED via POV with laceration to left first digit. [facility] and was st[facility] but did not receive any pain meds and pain in unrelieved at this time.

## 2016-06-06 ENCOUNTER — Encounter: Payer: Self-pay | Admitting: Emergency Medicine

## 2016-06-06 ENCOUNTER — Emergency Department: Payer: Medicaid Other

## 2016-06-06 ENCOUNTER — Emergency Department
Admission: EM | Admit: 2016-06-06 | Discharge: 2016-06-06 | Disposition: A | Discharge status: Home / Self Care | Payer: Medicaid Other | Attending: Emergency Medicine | Admitting: Emergency Medicine

## 2016-06-06 DIAGNOSIS — F17210 Nicotine dependence, cigarettes, uncomplicated: Secondary | ICD-10-CM | POA: Diagnosis not present

## 2016-06-06 DIAGNOSIS — Z79899 Other long term (current) drug therapy: Secondary | ICD-10-CM | POA: Insufficient documentation

## 2016-06-06 DIAGNOSIS — G40909 Epilepsy, unspecified, not intractable, without status epilepticus: Secondary | ICD-10-CM | POA: Insufficient documentation

## 2016-06-06 DIAGNOSIS — R569 Unspecified convulsions: Secondary | ICD-10-CM

## 2016-06-06 HISTORY — DX: Unspecified convulsions: R56.9

## 2016-06-06 LAB — URINALYSIS, COMPLETE (UACMP) WITH MICROSCOPIC
BILIRUBIN URINE: NEGATIVE
Bacteria, UA: NONE SEEN
Glucose, UA: NEGATIVE mg/dL
KETONES UR: NEGATIVE mg/dL
Leukocytes, UA: NEGATIVE
Nitrite: NEGATIVE
PROTEIN: 30 mg/dL — AB
SPECIFIC GRAVITY, URINE: 1.024 (ref 1.005–1.030)
pH: 5 (ref 5.0–8.0)

## 2016-06-06 LAB — COMPREHENSIVE METABOLIC PANEL
ALBUMIN: 4.2 g/dL (ref 3.5–5.0)
ALK PHOS: 73 U/L (ref 38–126)
ALT: 13 U/L — ABNORMAL LOW (ref 17–63)
ANION GAP: 5 (ref 5–15)
AST: 19 U/L (ref 15–41)
BUN: 10 mg/dL (ref 6–20)
CALCIUM: 9.1 mg/dL (ref 8.9–10.3)
CO2: 25 mmol/L (ref 22–32)
Chloride: 107 mmol/L (ref 101–111)
Creatinine, Ser: 0.97 mg/dL (ref 0.61–1.24)
GFR calc non Af Amer: >60 mL/min (ref >60)
GLUCOSE: 118 mg/dL — AB (ref 65–99)
Potassium: 3.8 mmol/L (ref 3.5–5.1)
SODIUM: 137 mmol/L (ref 135–145)
Total Bilirubin: 0.7 mg/dL (ref 0.3–1.2)
Total Protein: 7 g/dL (ref 6.5–8.1)

## 2016-06-06 LAB — CBC
HCT: 42.2 % (ref 40.0–52.0)
HEMOGLOBIN: 14.5 g/dL (ref 13.0–18.0)
MCH: 30.1 pg (ref 26.0–34.0)
MCHC: 34.5 g/dL (ref 32.0–36.0)
MCV: 87.4 fL (ref 80.0–100.0)
Platelets: 224 10*3/uL (ref 150–440)
RBC: 4.83 MIL/uL (ref 4.40–5.90)
RDW: 13.2 % (ref 11.5–14.5)
WBC: 9.8 10*3/uL (ref 3.8–10.6)

## 2016-06-06 LAB — URINE DRUG SCREEN, QUALITATIVE (ARMC ONLY)
AMPHETAMINES, UR SCREEN: NOT DETECTED
BARBITURATES, UR SCREEN: NOT DETECTED
Benzodiazepine, Ur Scrn: NOT DETECTED
COCAINE METABOLITE, UR ~~LOC~~: NOT DETECTED
Cannabinoid 50 Ng, Ur ~~LOC~~: POSITIVE — AB
MDMA (ECSTASY) UR SCREEN: NOT DETECTED
METHADONE SCREEN, URINE: NOT DETECTED
OPIATE, UR SCREEN: NOT DETECTED
Phencyclidine (PCP) Ur S: NOT DETECTED
TRICYCLIC, UR SCREEN: NOT DETECTED

## 2016-06-06 LAB — GLUCOSE, CAPILLARY: Glucose-Capillary: 115 mg/dL — ABNORMAL HIGH (ref 65–99)

## 2016-06-06 MED ORDER — SODIUM CHLORIDE 0.9 % IV SOLN
1000.0000 mg | Freq: Once | INTRAVENOUS | Status: AC
  Administered 2016-06-06: 1000 mg via INTRAVENOUS
  Filled 2016-06-06: qty 10

## 2016-06-06 MED ORDER — LEVETIRACETAM 500 MG PO TABS: 500.0000 mg | ORAL_TABLET | Freq: Two times a day (BID) | ORAL | 0 refills | Status: DC

## 2016-06-06 NOTE — ED Triage Notes (Signed)
Pt to ED via POV for c/o syncope and seizure. Pt states that he was feeling fine and then leaned up against the wall, next thing he remember is being rolled on his side. Pt girlfriend states that pt was shaking all over, shaking lasted less than 45 seconds. They are unsure if pt hit head when he fell. Pt is not c/o any pain anywhere. Pt states that he feels drowsy currently. Does not appear to be in any distress at this time.

## 2016-06-06 NOTE — ED Notes (Signed)
Pt sitting up in wheelchair eating chips with family in no acute distress.

## 2016-06-06 NOTE — ED Provider Notes (Signed)
[facility] Emergency Department Provider Note   First MD Initiated Contact with Patient 06/06/16 2016     (approximate)  I have reviewed the triage vital signs and the nursing notes.   HISTORY  Chief Complaint Loss of Consciousness and Seizures    HPI Shane Griffith is a 26 y.o. male with one previous history of seizure in his 26th birthday presents to the emergency department with history obtained from the patient's girlfriend stating that "patient stated it was hot and then passed out shaking all over lasting approximately 45 seconds". Patient does not recall anything after stating that it was hot. Patient denies any current symptoms. Patient states after the event that he felt exhausted. Patient denies any fever. Patient does state that he was outside at the time of the event and that it was very hot outside and states that he was outdoors for approximately 30-45 minutes.   Past Medical History:  Diagnosis Date  . Seizures (HCC)     There are no active problems to display for this patient.   Past Surgical History:  Procedure Laterality Date  . EYE SURGERY      Prior to Admission medications   Medication Sig Start Date End Date Taking? Authorizing Provider  cephALEXin (KEFLEX) 500 MG capsule Take 1 capsule (500 mg total) by mouth 4 (four) times daily. 05/28/16 06/07/16  Enid Derry, PA-C  fluticasone (FLONASE) 50 MCG/ACT nasal spray Place 2 sprays into both nostrils daily. 04/21/16 04/21/17  Enid Derry, PA-C  HYDROcodone-acetaminophen (NORCO/VICODIN) 5-325 MG tablet Take 1 tablet by mouth every 6 (six) hours as needed for moderate pain. 05/28/16   Enid Derry, PA-C  hydrOXYzine (ATARAX/VISTARIL) 50 MG tablet Take 1 tablet (50 mg total) by mouth 3 (three) times daily as needed for itching. 04/07/15   Joni Reining, PA-C  traMADol (ULTRAM) 50 MG tablet Take 1 tablet (50 mg total) by mouth every 6 (six) hours as needed for moderate pain. 04/07/15    Joni Reining, PA-C    Allergies No known drug allergies  Family history Noncontributory  Social History Social History  Substance Use Topics  . Smoking status: Current Every Day Smoker    Types: Cigarettes  . Smokeless tobacco: Never Used  . Alcohol use No    Review of Systems Constitutional: No fever/chills Eyes: No visual changes. ENT: No sore throat. Cardiovascular: Denies chest pain. Respiratory: Denies shortness of breath. Gastrointestinal: No abdominal pain.  No nausea, no vomiting.  No diarrhea.  No constipation. Genitourinary: Negative for dysuria. Musculoskeletal: Negative for back pain. Integumentary: Negative for rash. Neurological: Negative for headaches, focal weakness or numbness. Positive for syncope and seizure like activity.  ____________________________________________   PHYSICAL EXAM:  VITAL SIGNS: ED Triage Vitals  Enc Vitals Group     BP 06/06/16 1826 118/71     Pulse Rate 06/06/16 1826 (!) 101     Resp 06/06/16 1826 16     Temp 06/06/16 1826 98.8 F (37.1 C)     Temp Source 06/06/16 1826 Oral     SpO2 06/06/16 1826 97 %     Weight --      Height --      Head Circumference --      Peak Flow --      Pain Score 06/06/16 2011 6     Pain Loc --      Pain Edu? --      Excl. in GC? --     Constitutional:  Alert and oriented. Well appearing and in no acute distress. Eyes: Conjunctivae are normal. PERRL. EOMI. Head: Atraumatic. Mouth/Throat: Mucous membranes are moist. Oropharynx non-erythematous. Neck: No stridor.  Cardiovascular: Normal rate, regular rhythm. Good peripheral circulation. Grossly normal heart sounds. Respiratory: Normal respiratory effort.  No retractions. Lungs CTAB. Gastrointestinal: Soft and nontender. No distention.  Musculoskeletal: No lower extremity tenderness nor edema. No gross deformities of extremities. Neurologic:  Normal speech and language. No gross focal neurologic deficits are appreciated.  Skin:  Skin is  warm, dry and intact. No rash noted. Psychiatric: Mood and affect are normal. Speech and behavior are normal.  ____________________________________________   LABS (all labs ordered are listed, but only abnormal results are displayed)  Labs Reviewed  URINALYSIS, COMPLETE (UACMP) WITH MICROSCOPIC - Abnormal; Notable for the following:       Result Value   Color, Urine YELLOW (*)    APPearance HAZY (*)    Hgb urine dipstick SMALL (*)    Protein, ur 30 (*)    Squamous Epithelial / LPF 0-5 (*)    All other components within normal limits  COMPREHENSIVE METABOLIC PANEL - Abnormal; Notable for the following:    Glucose, Bld 118 (*)    ALT 13 (*)    All other components within normal limits  GLUCOSE, CAPILLARY - Abnormal; Notable for the following:    Glucose-Capillary 115 (*)    All other components within normal limits  CBC  URINE DRUG SCREEN, QUALITATIVE ([facility] ONLY)  CBG MONITORING, ED   ____________________________________________  EKG  ED ECG REPORT I, [place] N Scherrie [place], the attending physician, personally viewed and interpreted this ECG.   Date: 06/06/2016  EKG Time: 6:31 PM  Rate: 91  Rhythm: Normal sinus rhythm  Axis: Normal  Intervals: Normal  ST&T Change: None  ____________________________________________  RADIOLOGY I, Waldo N Tabita Corbo, personally viewed and evaluated these images (plain radiographs) as part of my medical decision making, as well as reviewing the written report by the radiologist.  Ct Head Wo Contrast  Result Date: 06/06/2016 CLINICAL DATA:  Syncope.  Possible seizure. EXAM: CT HEAD WITHOUT CONTRAST TECHNIQUE: Contiguous axial images were obtained from the base of the skull through the vertex without intravenous contrast. COMPARISON:  06/16/2015 head CT. FINDINGS: Brain: No evidence of parenchymal hemorrhage or extra-axial fluid collection. No mass lesion, mass effect, or midline shift. No CT evidence of acute infarction. Cerebral volume is age  appropriate. No ventriculomegaly. Vascular: No acute abnormality. Skull: No evidence of calvarial fracture. Sinuses/Orbits: The visualized paranasal sinuses are essentially clear. Other:  The mastoid air cells are unopacified. IMPRESSION: Negative head CT. No evidence of acute intracranial abnormality. No evidence of calvarial fracture . Electronically Signed   By: Delbert PhenixJason A Poff M.D.   On: 06/06/2016 19:34      Procedures   ____________________________________________   INITIAL IMPRESSION / ASSESSMENT AND PLAN / ED COURSE  Pertinent labs & imaging results that were available during my care of the patient were reviewed by me and considered in my medical decision making (see chart for details).  Patient discussed with Dr. Amada JupiterKirkpatrick neurologist on call who recommended Keppra and outpatient neurology follow-up. Patient observed in the emergency department no recurrence of symptoms patient a symptomatically at this time. Spoke with the patient at length regarding not driving or operating any heavy machinery as he may have abrupt loss of consciousness. Patient states that he does not have a license and a such as girlfriend and mother drives him. She was advised to  follow-up with Dr. Malvin Johns neurologist.      ____________________________________________  FINAL CLINICAL IMPRESSION(S) / ED DIAGNOSES  Final diagnoses:  Seizure (HCC)     MEDICATIONS GIVEN DURING THIS VISIT:  Medications - No data to display   NEW OUTPATIENT MEDICATIONS STARTED DURING THIS VISIT:  New Prescriptions   No medications on file    Modified Medications   No medications on file    Discontinued Medications   No medications on file     Note:  This document was prepared using Dragon voice recognition software and may include unintentional dictation errors.    Darci Current, MD 06/06/16 [phone]

## 2016-06-06 NOTE — ED Notes (Signed)
ED Provider at bedside. 

## 2016-06-08 ENCOUNTER — Ambulatory Visit: Payer: Self-pay | Admitting: Family Medicine

## 2016-07-13 DIAGNOSIS — F17210 Nicotine dependence, cigarettes, uncomplicated: Secondary | ICD-10-CM | POA: Diagnosis not present

## 2016-07-13 DIAGNOSIS — R112 Nausea with vomiting, unspecified: Secondary | ICD-10-CM | POA: Insufficient documentation

## 2016-07-13 DIAGNOSIS — R1030 Lower abdominal pain, unspecified: Secondary | ICD-10-CM | POA: Insufficient documentation

## 2016-07-13 DIAGNOSIS — Z79899 Other long term (current) drug therapy: Secondary | ICD-10-CM | POA: Insufficient documentation

## 2016-07-13 DIAGNOSIS — R197 Diarrhea, unspecified: Secondary | ICD-10-CM | POA: Diagnosis not present

## 2016-07-13 LAB — COMPREHENSIVE METABOLIC PANEL
ALBUMIN: 4.9 g/dL (ref 3.5–5.0)
ALT: 17 U/L (ref 17–63)
ANION GAP: 8 (ref 5–15)
AST: 21 U/L (ref 15–41)
Alkaline Phosphatase: 83 U/L (ref 38–126)
BUN: 9 mg/dL (ref 6–20)
CHLORIDE: 106 mmol/L (ref 101–111)
CO2: 25 mmol/L (ref 22–32)
Calcium: 9.9 mg/dL (ref 8.9–10.3)
Creatinine, Ser: 0.95 mg/dL (ref 0.61–1.24)
GFR calc Af Amer: >60 mL/min (ref >60)
GFR calc non Af Amer: >60 mL/min (ref >60)
GLUCOSE: 105 mg/dL — AB (ref 65–99)
POTASSIUM: 4.3 mmol/L (ref 3.5–5.1)
SODIUM: 139 mmol/L (ref 135–145)
Total Bilirubin: 0.9 mg/dL (ref 0.3–1.2)
Total Protein: 8 g/dL (ref 6.5–8.1)

## 2016-07-13 LAB — CBC
HEMATOCRIT: 46.5 % (ref 40.0–52.0)
HEMOGLOBIN: 15.7 g/dL (ref 13.0–18.0)
MCH: 29.3 pg (ref 26.0–34.0)
MCHC: 33.9 g/dL (ref 32.0–36.0)
MCV: 86.7 fL (ref 80.0–100.0)
Platelets: 252 10*3/uL (ref 150–440)
RBC: 5.37 MIL/uL (ref 4.40–5.90)
RDW: 13.1 % (ref 11.5–14.5)
WBC: 12.3 10*3/uL — ABNORMAL HIGH (ref 3.8–10.6)

## 2016-07-13 LAB — URINALYSIS, COMPLETE (UACMP) WITH MICROSCOPIC
Bilirubin Urine: NEGATIVE
Glucose, UA: NEGATIVE mg/dL
Ketones, ur: 5 mg/dL — AB
Leukocytes, UA: NEGATIVE
NITRITE: NEGATIVE
PROTEIN: 30 mg/dL — AB
Specific Gravity, Urine: 1.024 (ref 1.005–1.030)
pH: 5 (ref 5.0–8.0)

## 2016-07-13 LAB — LIPASE, BLOOD: Lipase: 18 U/L (ref 11–51)

## 2016-07-13 NOTE — ED Triage Notes (Signed)
.  Patient ambulatory to triage with steady gait, without difficulty or distress noted; pt reports N/V/D, lower abd pain since this am

## 2016-07-14 ENCOUNTER — Emergency Department: Payer: Medicaid Other

## 2016-07-14 ENCOUNTER — Emergency Department
Admission: EM | Admit: 2016-07-14 | Discharge: 2016-07-14 | Disposition: A | Discharge status: Home / Self Care | Payer: Medicaid Other | Attending: Emergency Medicine | Admitting: Emergency Medicine

## 2016-07-14 ENCOUNTER — Encounter: Payer: Self-pay | Admitting: Radiology

## 2016-07-14 DIAGNOSIS — R197 Diarrhea, unspecified: Secondary | ICD-10-CM

## 2016-07-14 DIAGNOSIS — R1030 Lower abdominal pain, unspecified: Secondary | ICD-10-CM

## 2016-07-14 DIAGNOSIS — R112 Nausea with vomiting, unspecified: Secondary | ICD-10-CM

## 2016-07-14 MED ORDER — ONDANSETRON 4 MG PO TBDP: 4.0000 mg | ORAL_TABLET | Freq: Three times a day (TID) | ORAL | 0 refills | Status: DC | PRN

## 2016-07-14 MED ORDER — ONDANSETRON HCL 4 MG/2ML IJ SOLN
4.0000 mg | Freq: Once | INTRAMUSCULAR | Status: AC
  Administered 2016-07-14: 4 mg via INTRAVENOUS
  Filled 2016-07-14: qty 2

## 2016-07-14 MED ORDER — IOPAMIDOL (ISOVUE-300) INJECTION 61%
30.0000 mL | Freq: Once | INTRAVENOUS | Status: AC
  Administered 2016-07-14: 30 mL via ORAL

## 2016-07-14 MED ORDER — IOPAMIDOL (ISOVUE-300) INJECTION 61%
100.0000 mL | Freq: Once | INTRAVENOUS | Status: AC | PRN
  Administered 2016-07-14: 100 mL via INTRAVENOUS

## 2016-07-14 MED ORDER — SODIUM CHLORIDE 0.9 % IV BOLUS (SEPSIS)
1000.0000 mL | Freq: Once | INTRAVENOUS | Status: AC
  Administered 2016-07-14: 1000 mL via INTRAVENOUS

## 2016-07-14 NOTE — ED Provider Notes (Signed)
[facility] Emergency Department Provider Note   ____________________________________________   First MD Initiated Contact with Patient 07/14/16 [phone]     (approximate)  I have reviewed the triage vital signs and the nursing notes.   HISTORY  Chief Complaint Abdominal Pain; Emesis; and Diarrhea    HPI Shane Griffith is a 26 y.o. male who comes into the hospital today with some vomiting and diarrhea. The patient also reports that his sides hurt. He reports that he was having pain in his lower abdomen on both sides all morning long. He states that he started having vomiting and diarrhea around 6. He reports that he vomited and also had diarrhea on himself. His emesis has been yellowish in appearance and he reports it was about 2-3 times. He is also had some brownish green watery diarrhea. The patient rates his abdominal pain as a 7/10 in intensity. The patient denies any sick contacts, fevers. He has had some chills. The patient is here today for evaluation.   Past Medical History:  Diagnosis Date  . Seizures (HCC)     There are no active problems to display for this patient.   Past Surgical History:  Procedure Laterality Date  . EYE SURGERY      Prior to Admission medications   Medication Sig Start Date End Date Taking? Authorizing Provider  fluticasone (FLONASE) 50 MCG/ACT nasal spray Place 2 sprays into both nostrils daily. 04/21/16 04/21/17  Enid Derry, PA-C  HYDROcodone-acetaminophen (NORCO/VICODIN) 5-325 MG tablet Take 1 tablet by mouth every 6 (six) hours as needed for moderate pain. 05/28/16   Enid Derry, PA-C  hydrOXYzine (ATARAX/VISTARIL) 50 MG tablet Take 1 tablet (50 mg total) by mouth 3 (three) times daily as needed for itching. 04/07/15   Joni Reining, PA-C  levETIRAcetam (KEPPRA) 500 MG tablet Take 1 tablet (500 mg total) by mouth 2 (two) times daily. 06/06/16   Darci Current, MD  ondansetron (ZOFRAN ODT) 4 MG disintegrating  tablet Take 1 tablet (4 mg total) by mouth every 8 (eight) hours as needed for nausea or vomiting. 07/14/16   Rebecka Apley, MD  traMADol (ULTRAM) 50 MG tablet Take 1 tablet (50 mg total) by mouth every 6 (six) hours as needed for moderate pain. 04/07/15   Joni Reining, PA-C    Allergies Patient has no known allergies.  No family history on file.  Social History Social History  Substance Use Topics  . Smoking status: Current Every Day Smoker    Types: Cigarettes  . Smokeless tobacco: Never Used  . Alcohol use No    Review of Systems  Constitutional: chills Eyes: No visual changes. ENT: No sore throat. Cardiovascular: Denies chest pain. Respiratory: Denies shortness of breath. Gastrointestinal:  abdominal pain.   nausea, vomiting.  diarrhea.  No constipation. Genitourinary: Negative for dysuria. Musculoskeletal: Negative for back pain. Skin: Negative for rash. Neurological: Negative for headaches, focal weakness or numbness.   ____________________________________________   PHYSICAL EXAM:  VITAL SIGNS: ED Triage Vitals [07/13/16 2234]  Enc Vitals Group     BP 136/78     Pulse Rate (!) 107     Resp 18     Temp 98.5 F (36.9 C)     Temp Source Oral     SpO2 100 %     Weight 170 lb (77.1 kg)     Height 6' (1.829 m)     Head Circumference      Peak Flow  Pain Score 8     Pain Loc      Pain Edu?      Excl. in GC?     Constitutional: Alert and oriented. Well appearing and in Mild distress. Eyes: Conjunctivae are normal. PERRL. EOMI. Head: Atraumatic. Nose: No congestion/rhinnorhea. Mouth/Throat: Mucous membranes are moist.  Oropharynx non-erythematous. Cardiovascular: Normal rate, regular rhythm. Grossly normal heart sounds.  Good peripheral circulation. Respiratory: Normal respiratory effort.  No retractions. Lungs CTAB. Gastrointestinal: Soft minimal lower abdominal tenderness to palpation. No distention. Positive bowel sounds Musculoskeletal: No  lower extremity tenderness nor edema.   Neurologic:  Normal speech and language.  Skin:  Skin is warm, dry and intact. Marland Kitchen Psychiatric: Mood and affect are normal.   ____________________________________________   LABS (all labs ordered are listed, but only abnormal results are displayed)  Labs Reviewed  COMPREHENSIVE METABOLIC PANEL - Abnormal; Notable for the following:       Result Value   Glucose, Bld 105 (*)    All other components within normal limits  CBC - Abnormal; Notable for the following:    WBC 12.3 (*)    All other components within normal limits  URINALYSIS, COMPLETE (UACMP) WITH MICROSCOPIC - Abnormal; Notable for the following:    Color, Urine YELLOW (*)    APPearance CLEAR (*)    Hgb urine dipstick MODERATE (*)    Ketones, ur 5 (*)    Protein, ur 30 (*)    Bacteria, UA RARE (*)    Squamous Epithelial / LPF 0-5 (*)    All other components within normal limits  LIPASE, BLOOD   ____________________________________________  EKG  none ____________________________________________  RADIOLOGY  Ct Abdomen Pelvis W Contrast  Result Date: 07/14/2016 CLINICAL DATA:  26 year old male with lower abdominal pain and vomiting and diarrhea. EXAM: CT ABDOMEN AND PELVIS WITH CONTRAST TECHNIQUE: Multidetector CT imaging of the abdomen and pelvis was performed using the standard protocol following bolus administration of intravenous contrast. CONTRAST:  ISOVUE-300 IOPAMIDOL (ISOVUE-300) INJECTION 61% COMPARISON:  Abdominal CT dated 11/09/2015 FINDINGS: Lower chest: The visualized lung bases are clear. No intra-abdominal free air or free fluid. Hepatobiliary: No focal liver abnormality is seen. No gallstones, gallbladder wall thickening, or biliary dilatation. Pancreas: Unremarkable. No pancreatic ductal dilatation or surrounding inflammatory changes. Spleen: Normal in size without focal abnormality. Adrenals/Urinary Tract: There multiple punctate nonobstructing bilateral renal  calculi. No hydronephrosis. The visualized ureters and urinary bladder appear unremarkable. Stomach/Bowel: Stomach is within normal limits. Appendix appears normal. No evidence of bowel wall thickening, distention, or inflammatory changes. Vascular/Lymphatic: The abdominal aorta and IVC appear unremarkable. The origins of the celiac axis, SMA, IMA appear patent. The SMV, splenic vein, and main portal vein are patent. There is focal compression of the left renal vein between the aorta and the SMA. There is no adenopathy. Reproductive: The prostate and seminal vesicles are grossly unremarkable Other: None Musculoskeletal: No acute or significant osseous findings. IMPRESSION: 1. Small nonobstructing bilateral renal calculi. No hydronephrosis or obstructing stone. 2. No evidence of bowel obstruction or active inflammation. Normal appendix. 3. 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 This can lead to renal venous hypertension and hematuria. Renal venography may provide better evaluation of the pressure gradient if there is clinical concern for nutcracker syndrome. Electronically Signed   By: Elgie Collard M.D.   On: 07/14/2016 02:49    ____________________________________________   PROCEDURES  Procedure(s) performed: None  Procedures  Critical Care performed: No  ____________________________________________  INITIAL IMPRESSION / ASSESSMENT AND PLAN / ED COURSE  Pertinent labs & imaging results that were available during my care of the patient were reviewed by me and considered in my medical decision making (see chart for details).  This is a 26 year old male who comes into the hospital today with some abdominal pain, nausea vomiting and diarrhea. The patient reports that he is unable to keep anything down and he has not eaten much today. I did give the patient a liter of normal saline as well as some Zofran. He does have a mildly elevated white blood cell count. Given  the patient's lower abdominal pain my concern is possibly for a colitis versus appendicitis. I will send the patient for CT scan of his abdomen for further evaluation.  Clinical Course as of Jul 15 351  Tue Jul 14, 2016  0302 1. Small nonobstructing bilateral renal calculi. No hydronephrosis or obstructing stone. 2. No evidence of bowel obstruction or active inflammation. Normal appendix. 3. 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 This can lead to renal venous hypertension and hematuria. Renal venography may provide better evaluation of the pressure gradient if there is clinical concern for nutcracker syndrome.   CT Abdomen Pelvis W Contrast [AW]    Clinical Course User Index [AW] Rebecka ApleyWebster, Frayda Egley P, MD   The patient's CT scan is unremarkable. There is some focal compression of the left renal vein between the aorta and SMA but the patient does not have any urinary problems, hematuria or elevated creatinine. The patient's blood pressure is unremarkable. He will be discharged home to follow-up with his primary care physician. I feel that his symptoms are due to gastroenteritis.  ____________________________________________   FINAL CLINICAL IMPRESSION(S) / ED DIAGNOSES  Final diagnoses:  Nausea vomiting and diarrhea  Lower abdominal pain      NEW MEDICATIONS STARTED DURING THIS VISIT:  New Prescriptions   ONDANSETRON (ZOFRAN ODT) 4 MG DISINTEGRATING TABLET    Take 1 tablet (4 mg total) by mouth every 8 (eight) hours as needed for nausea or vomiting.     Note:  This document was prepared using Dragon voice recognition software and may include unintentional dictation errors.    Rebecka ApleyWebster, Hinley Brimage P, MD 07/14/16 [phone]

## 2016-12-04 ENCOUNTER — Other Ambulatory Visit: Payer: Self-pay

## 2016-12-04 ENCOUNTER — Emergency Department: Payer: Medicaid Other

## 2016-12-04 ENCOUNTER — Encounter: Payer: Self-pay | Admitting: Emergency Medicine

## 2016-12-04 DIAGNOSIS — F17210 Nicotine dependence, cigarettes, uncomplicated: Secondary | ICD-10-CM | POA: Insufficient documentation

## 2016-12-04 DIAGNOSIS — Z79899 Other long term (current) drug therapy: Secondary | ICD-10-CM | POA: Insufficient documentation

## 2016-12-04 DIAGNOSIS — M25512 Pain in left shoulder: Secondary | ICD-10-CM | POA: Insufficient documentation
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
## 2016-12-05 ENCOUNTER — Emergency Department
Admission: EM | Admit: 2016-12-05 | Discharge: 2016-12-05 | Disposition: A | Discharge status: Home / Self Care | Payer: Medicaid Other | Attending: Emergency Medicine | Admitting: Emergency Medicine

## 2016-12-05 MED ORDER — IBUPROFEN 600 MG PO TABS: 600.0000 mg | ORAL_TABLET | Freq: Three times a day (TID) | ORAL | 0 refills | Status: AC | PRN

## 2016-12-05 MED ORDER — IBUPROFEN 600 MG PO TABS
600.0000 mg | ORAL_TABLET | Freq: Once | ORAL | Status: AC
  Administered 2016-12-05: 600 mg via ORAL
  Filled 2016-12-05: qty 1
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
## 2017-01-10 ENCOUNTER — Encounter: Payer: Self-pay | Admitting: Emergency Medicine

## 2017-01-10 ENCOUNTER — Other Ambulatory Visit: Payer: Self-pay

## 2017-01-10 ENCOUNTER — Emergency Department
Admission: EM | Admit: 2017-01-10 | Discharge: 2017-01-10 | Disposition: A | Discharge status: Home / Self Care | Payer: Medicaid Other | Attending: Emergency Medicine | Admitting: Emergency Medicine

## 2017-01-10 DIAGNOSIS — Z79899 Other long term (current) drug therapy: Secondary | ICD-10-CM | POA: Diagnosis not present

## 2017-01-10 DIAGNOSIS — R197 Diarrhea, unspecified: Secondary | ICD-10-CM | POA: Diagnosis not present

## 2017-01-10 DIAGNOSIS — R112 Nausea with vomiting, unspecified: Secondary | ICD-10-CM

## 2017-01-10 DIAGNOSIS — F17210 Nicotine dependence, cigarettes, uncomplicated: Secondary | ICD-10-CM | POA: Insufficient documentation

## 2017-01-10 DIAGNOSIS — R1030 Lower abdominal pain, unspecified: Secondary | ICD-10-CM | POA: Diagnosis present

## 2017-01-10 LAB — COMPREHENSIVE METABOLIC PANEL
ALT: 50 U/L (ref 17–63)
AST: 32 U/L (ref 15–41)
Albumin: 4.7 g/dL (ref 3.5–5.0)
Alkaline Phosphatase: 106 U/L (ref 38–126)
Anion gap: 9 (ref 5–15)
BUN: 11 mg/dL (ref 6–20)
CO2: 26 mmol/L (ref 22–32)
CREATININE: 0.78 mg/dL (ref 0.61–1.24)
Calcium: 9.6 mg/dL (ref 8.9–10.3)
Chloride: 104 mmol/L (ref 101–111)
GFR calc Af Amer: >60 mL/min (ref >60)
Glucose, Bld: 97 mg/dL (ref 65–99)
Potassium: 4.5 mmol/L (ref 3.5–5.1)
SODIUM: 139 mmol/L (ref 135–145)
Total Bilirubin: 0.7 mg/dL (ref 0.3–1.2)
Total Protein: 7.7 g/dL (ref 6.5–8.1)

## 2017-01-10 LAB — URINALYSIS, COMPLETE (UACMP) WITH MICROSCOPIC
Bacteria, UA: NONE SEEN
Bilirubin Urine: NEGATIVE
Glucose, UA: NEGATIVE mg/dL
KETONES UR: NEGATIVE mg/dL
Leukocytes, UA: NEGATIVE
Nitrite: NEGATIVE
PH: 5 (ref 5.0–8.0)
Protein, ur: NEGATIVE mg/dL
SPECIFIC GRAVITY, URINE: 1.018 (ref 1.005–1.030)

## 2017-01-10 LAB — CBC
HCT: 46.1 % (ref 40.0–52.0)
Hemoglobin: 15.6 g/dL (ref 13.0–18.0)
MCH: 30 pg (ref 26.0–34.0)
MCHC: 33.9 g/dL (ref 32.0–36.0)
MCV: 88.5 fL (ref 80.0–100.0)
PLATELETS: 220 10*3/uL (ref 150–440)
RBC: 5.21 MIL/uL (ref 4.40–5.90)
RDW: 13.4 % (ref 11.5–14.5)
WBC: 15.2 10*3/uL — ABNORMAL HIGH (ref 3.8–10.6)

## 2017-01-10 LAB — LIPASE, BLOOD: LIPASE: 22 U/L (ref 11–51)

## 2017-01-10 MED ORDER — ONDANSETRON HCL 4 MG PO TABS: 4.0000 mg | ORAL_TABLET | Freq: Three times a day (TID) | ORAL | 0 refills | Status: DC | PRN

## 2017-01-10 MED ORDER — KETOROLAC TROMETHAMINE 30 MG/ML IJ SOLN
INTRAMUSCULAR | Status: AC
  Administered 2017-01-10: 15 mg via INTRAVENOUS
  Filled 2017-01-10: qty 1

## 2017-01-10 MED ORDER — KETOROLAC TROMETHAMINE 30 MG/ML IJ SOLN
15.0000 mg | Freq: Once | INTRAMUSCULAR | Status: AC
  Administered 2017-01-10: 15 mg via INTRAVENOUS

## 2017-01-10 MED ORDER — ONDANSETRON HCL 4 MG/2ML IJ SOLN
4.0000 mg | Freq: Once | INTRAMUSCULAR | Status: AC
  Administered 2017-01-10: 4 mg via INTRAVENOUS

## 2017-01-10 MED ORDER — ONDANSETRON HCL 4 MG/2ML IJ SOLN
INTRAMUSCULAR | Status: AC
  Filled 2017-01-10: qty 2

## 2017-01-10 MED ORDER — SODIUM CHLORIDE 0.9 % IV BOLUS (SEPSIS)
1000.0000 mL | Freq: Once | INTRAVENOUS | Status: AC
  Administered 2017-01-10: 1000 mL via INTRAVENOUS

## 2017-01-10 NOTE — ED Notes (Addendum)
FIRST NURSE NOTE:  Pt c/o n/v/d and subjective fevers that started today.

## 2017-01-10 NOTE — ED Triage Notes (Signed)
Pt presents to ED via POV c/o bil lower abd pain that started in bil flank, woke up with pain today. Emesis x4.

## 2017-01-10 NOTE — ED Provider Notes (Addendum)
[facility] Emergency Department Provider Note  ____________________________________________   I have reviewed the triage vital signs and the nursing notes. Where available I have reviewed prior notes and, if possible and indicated, outside hospital notes.    HISTORY  Chief Complaint Abdominal Pain and Emesis    HPI Shane Griffith is a 26 y.o. male who presents today complaining of nausea vomiting diarrhea.  Patient does have.  He is complaining of diffuse abdominal discomfort especially with vomiting.  He had diarrhea and vomiting starting this morning.  He denies fever.  He states that he has had no focal abdominal pain it "hurts everywhere" especially when he is vomiting.  He has had no bloody or bilious emesis no melena no bright red blood per rectum.  Has had no prior treatment. Pain is a mild discomfort which is worse with vomiting, nothing else makes it better no prior prevention, does not feel like prior kidney stones.   Past Medical History:  Diagnosis Date  . Seizures (HCC)     There are no active problems to display for this patient.   Past Surgical History:  Procedure Laterality Date  . EYE SURGERY      Prior to Admission medications   Medication Sig Start Date End Date Taking? Authorizing Provider  fluticasone (FLONASE) 50 MCG/ACT nasal spray Place 2 sprays into both nostrils daily. 04/21/16 04/21/17  Enid DerryWagner, Ashley, PA-C  HYDROcodone-acetaminophen (NORCO/VICODIN) 5-325 MG tablet Take 1 tablet by mouth every 6 (six) hours as needed for moderate pain. 05/28/16   Enid DerryWagner, Ashley, PA-C  hydrOXYzine (ATARAX/VISTARIL) 50 MG tablet Take 1 tablet (50 mg total) by mouth 3 (three) times daily as needed for itching. 04/07/15   Joni ReiningSmith, Ronald K, PA-C  ibuprofen (ADVIL,MOTRIN) 600 MG tablet Take 1 tablet (600 mg total) every 8 (eight) hours as needed by mouth. 12/05/16   Merrily Brittleifenbark, Neil, MD  levETIRAcetam (KEPPRA) 500 MG tablet Take 1 tablet (500 mg total) by  mouth 2 (two) times daily. 06/06/16   Darci CurrentBrown, Elfrida N, MD  ondansetron (ZOFRAN ODT) 4 MG disintegrating tablet Take 1 tablet (4 mg total) by mouth every 8 (eight) hours as needed for nausea or vomiting. 07/14/16   Rebecka ApleyWebster, Allison P, MD  traMADol (ULTRAM) 50 MG tablet Take 1 tablet (50 mg total) by mouth every 6 (six) hours as needed for moderate pain. 04/07/15   Joni ReiningSmith, Ronald K, PA-C    Allergies Patient has no known allergies.  History reviewed. No pertinent family history.  Social History Social History   Tobacco Use  . Smoking status: Current Every Day Smoker    Packs/day: 1.00    Types: Cigarettes  . Smokeless tobacco: Never Used  Substance Use Topics  . Alcohol use: No  . Drug use: Yes    Types: Marijuana    Review of Systems Constitutional: No fever/chills Eyes: No visual changes. ENT: No sore throat. No stiff neck no neck pain Cardiovascular: Denies chest pain. Respiratory: Denies shortness of breath. Gastrointestinal:   Does have nausea vomiting diarrhea Genitourinary: Negative for dysuria. Musculoskeletal: Negative lower extremity swelling Skin: Negative for rash. Neurological: Negative for severe headaches, focal weakness or numbness.   ____________________________________________   PHYSICAL EXAM:  VITAL SIGNS: ED Triage Vitals  Enc Vitals Group     BP 01/10/17 1609 133/78     Pulse Rate 01/10/17 1609 100     Resp 01/10/17 1609 20     Temp 01/10/17 1609 98.6 F (37 C)  Temp Source 01/10/17 1609 Oral     SpO2 01/10/17 1609 99 %     Weight 01/10/17 1610 175 lb (79.4 kg)     Height 01/10/17 1610 6' (1.829 m)     Head Circumference --      Peak Flow --      Pain Score 01/10/17 1618 6     Pain Loc --      Pain Edu? --      Excl. in GC? --     Constitutional: Alert and oriented. Well appearing and in no acute distress. Eyes: Conjunctivae are normal Head: Atraumatic HEENT: No congestion/rhinnorhea. Mucous membranes are moist.  Oropharynx  non-erythematous Neck:   Nontender with no meningismus, no masses, no stridor Cardiovascular: Normal rate, regular rhythm. Grossly normal heart sounds.  Good peripheral circulation. Respiratory: Normal respiratory effort.  No retractions. Lungs CTAB. Abdominal: Soft and to see mildly tender no focal tenderness specifically no tenderness over McBurney's point or in the right lower quadrant. No distention. No guarding no rebound Back:  There is no focal tenderness or step off.  there is no midline tenderness there are no lesions noted. there is no CVA tenderness  Musculoskeletal: No lower extremity tenderness, no upper extremity tenderness. No joint effusions, no DVT signs strong distal pulses no edema Neurologic:  Normal speech and language. No gross focal neurologic deficits are appreciated.  Skin:  Skin is warm, dry and intact. No rash noted. Psychiatric: Mood and affect are normal. Speech and behavior are normal.  ____________________________________________   LABS (all labs ordered are listed, but only abnormal results are displayed)  Labs Reviewed  CBC - Abnormal; Notable for the following components:      Result Value   WBC 15.2 (*)    All other components within normal limits  URINALYSIS, COMPLETE (UACMP) WITH MICROSCOPIC - Abnormal; Notable for the following components:   Color, Urine YELLOW (*)    APPearance CLEAR (*)    Hgb urine dipstick SMALL (*)    Squamous Epithelial / LPF 0-5 (*)    All other components within normal limits  LIPASE, BLOOD  COMPREHENSIVE METABOLIC PANEL    Pertinent labs  results that were available during my care of the patient were reviewed by me and considered in my medical decision making (see chart for details). ____________________________________________  EKG  I personally interpreted any EKGs ordered by me or triage  ____________________________________________  RADIOLOGY  Pertinent labs & imaging results that were available during my  care of the patient were reviewed by me and considered in my medical decision making (see chart for details). If possible, patient and/or family made aware of any abnormal findings.  No results found. ____________________________________________    PROCEDURES  Procedure(s) performed: None  Procedures  Critical Care performed: None  ____________________________________________   INITIAL IMPRESSION / ASSESSMENT AND PLAN / ED COURSE  Pertinent labs & imaging results that were available during my care of the patient were reviewed by me and considered in my medical decision making (see chart for details).  Patient with nausea vomiting and diarrhea for a day, at this time I have very low suspicion for significant abdominal pathology.  Possibility of kidney stone exists, patient does have a known history of very small kidney stones but is already had 2 CT abdomen pelvis and if he does have a kidney stone I do not think it will require emergent imaging today, there is certainly no indication that he has an and possible stone and frankly  at this time his diarrhea and vomiting and diffuse abdominal discomfort are much more likely to be consistent with viral gastroenteritis.  Marland Kitchen.  ----------------------------------------- 6:23 PM on 01/10/2017 -----------------------------------------  She is pain-free, no abdominal tenderness or pain, abdomen is benign.  Considering the patient's symptoms, medical history, and physical examination today, I have low suspicion for cholecystitis or biliary pathology, pancreatitis, perforation or bowel obstruction, hernia, intra-abdominal abscess, AAA or dissection, volvulus or intussusception, mesenteric ischemia, ischemic gut, pyelonephritis or appendicitis.    ____________________________________________   FINAL CLINICAL IMPRESSION(S) / ED DIAGNOSES  Final diagnoses:  None      This chart was dictated using voice recognition software.  Despite best  efforts to proofread,  errors can occur which can change meaning.      Jeanmarie PlantMcShane, James A, MD 01/10/17 1713    Jeanmarie PlantMcShane, James A, MD 01/10/17 [phone]

## 2017-01-10 NOTE — ED Notes (Signed)
NAD noted at time of D/C. Pt denies questions or concerns. Pt ambulatory to the lobby at this time.  

## 2018-02-28 ENCOUNTER — Encounter: Payer: Self-pay | Admitting: Urology

## 2018-02-28 ENCOUNTER — Ambulatory Visit (INDEPENDENT_AMBULATORY_CARE_PROVIDER_SITE_OTHER): Payer: Medicaid Other | Admitting: Urology

## 2018-02-28 VITALS — BP 125/72 | HR 91 | Ht 73.0 in | Wt 177.0 lb

## 2018-02-28 DIAGNOSIS — N200 Calculus of kidney: Secondary | ICD-10-CM | POA: Diagnosis not present

## 2018-02-28 LAB — MICROSCOPIC EXAMINATION
BACTERIA UA: NONE SEEN
EPITHELIAL CELLS (NON RENAL): NONE SEEN /HPF (ref 0–10)
WBC UA: NONE SEEN /HPF (ref 0–5)

## 2018-02-28 LAB — URINALYSIS, COMPLETE
BILIRUBIN UA: NEGATIVE
Glucose, UA: NEGATIVE
Ketones, UA: NEGATIVE
Leukocytes, UA: NEGATIVE
Nitrite, UA: NEGATIVE
PH UA: 7 (ref 5.0–7.5)
Protein, UA: NEGATIVE
Specific Gravity, UA: 1.02 (ref 1.005–1.030)
UUROB: 1 mg/dL (ref 0.2–1.0)

## 2018-02-28 NOTE — Patient Instructions (Signed)

## 2018-02-28 NOTE — Progress Notes (Signed)
   02/28/2018 1:12 PM   Revonda Humphrey 1990-08-23 [id number]  Referring provider: Kerman Passey, MD [address] [place], [place] [address]  CC: History of kidney stones  HPI: I saw Mr. Fragoza in urology clinic today in consultation for "hematuria" from Dr. Sherie Don.  He is a 28 year old male with reported history of nephrolithiasis, however he states he has not passed a stone before.  He was found to have trace blood on a urine dip with his primary care, however no microscopic analysis was performed.  He has been seen in the ER multiple times for abdominal pain in the last few years, and CT has never shown a ureteral stone.  Most recent CT in June 2018 showed very small punctate nonobstructing bilateral renal calculi, but no hydronephrosis or obstructing stones.  He denies any gross hematuria.  He is an active smoker with a 5-pack-year history.  He currently denies any flank pain, nausea, fevers, or chills.  He denies urinary symptoms including weak stream, dysuria, urgency, or frequency.  There are no aggravating or alleviating factors.  Severity is mild.  Urinalysis today shows no microscopic hematuria.  PMH: Past Medical History:  Diagnosis Date  . Seizures (HCC)     Allergies: No Known Allergies  Family History: No family history on file.  Social History:  reports that he has been smoking cigarettes. He has been smoking about 1.00 pack per day. He has never used smokeless tobacco. He reports current drug use. Drug: Marijuana. He reports that he does not drink alcohol.  ROS: Please see flowsheet from today's date for complete review of systems.  Physical Exam: BP 125/72   Pulse 91   Ht 6\' 1"  (1.854 m)   Wt 177 lb (80.3 kg)   BMI 23.35 kg/m    Constitutional:  Alert and oriented, No acute distress. Cardiovascular: No clubbing, cyanosis, or edema. Respiratory: Normal respiratory effort, no increased work of breathing. GI: Abdomen is soft, nontender,  nondistended, no abdominal masses GU: No CVA tenderness Lymph: No cervical or inguinal lymphadenopathy. Skin: No rashes, bruises or suspicious lesions. Neurologic: Grossly intact, no focal deficits, moving all 4 extremities. Psychiatric: Normal mood and affect.  Laboratory Data: Urinalysis today 0 WBCs, 0-2 RBCs, no bacteria, nitrite negative  Pertinent Imaging: I have personally reviewed the CT abdomen pelvis with contrast dated 06/2016.  Small punctate bilateral nonobstructing renal calculi, no hydronephrosis or ureteral stones.  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  Assessment & Plan:   In summary, the patient is a 28 year old male with history of trace blood on urine dipstick, however no microscopic or gross hematuria.  We discussed this does not warrant hematuria work-up with CT and cystoscopy.  He is asymptomatic without flank pain, or other clinical concern for kidney stones or nutcracker syndrome.  We discussed general stone prevention strategies including adequate hydration with goal of producing 2.5 L of urine daily, increasing citric acid intake, increasing calcium intake during high oxalate meals, minimizing animal protein, and decreasing salt intake. Information about dietary recommendations given today.   Follow-up as needed  Sondra Come, MD  [facility] [address] [place], [place] [address] [phone]

## 2019-04-19 IMAGING — CT CT HEAD W/O CM
3 series · 15 of 46 positions shown, 18 images · non-contrast
Comparison: 06/16/2015 head CT.

CLINICAL DATA: Syncope.  Possible seizure.

EXAM:
CT HEAD WITHOUT CONTRAST
TECHNIQUE: Contiguous axial images were obtained from the base of the skull
through the vertex without intravenous contrast.

[Series 2: head wo · axial · 0.40mm/px · z∈[+366,+486]mm · 9 of 29 slices shown, 12 images]
[im 3/29  brain]
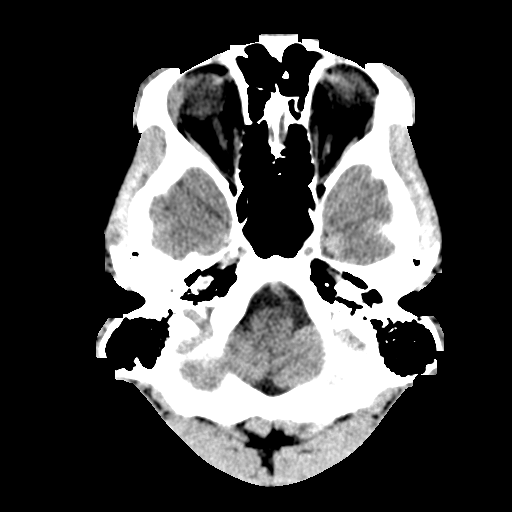
[im 3/29  bone]
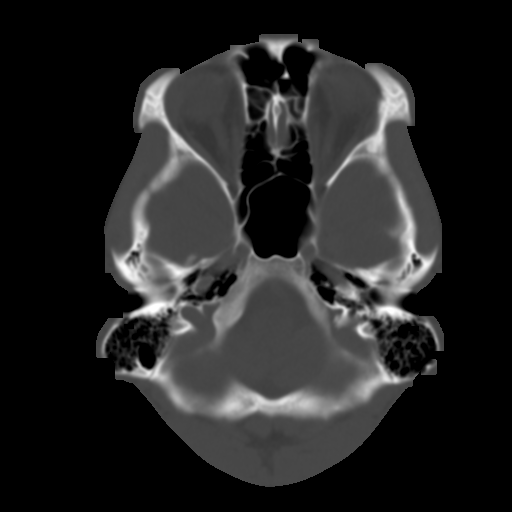
[im 6/29  brain]
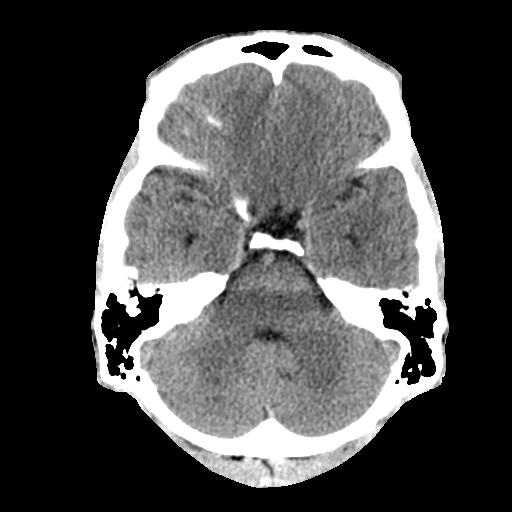
[im 9/29  brain]
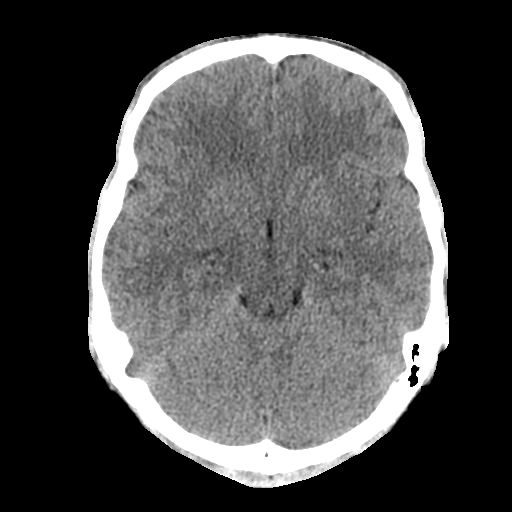
[im 12/29  brain]
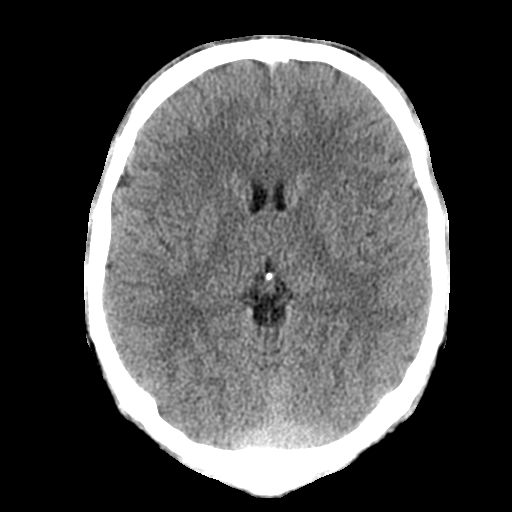
[im 15/29  brain]
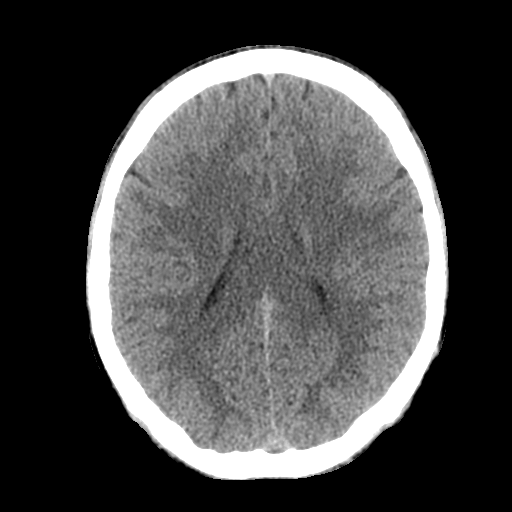
[im 15/29  bone]
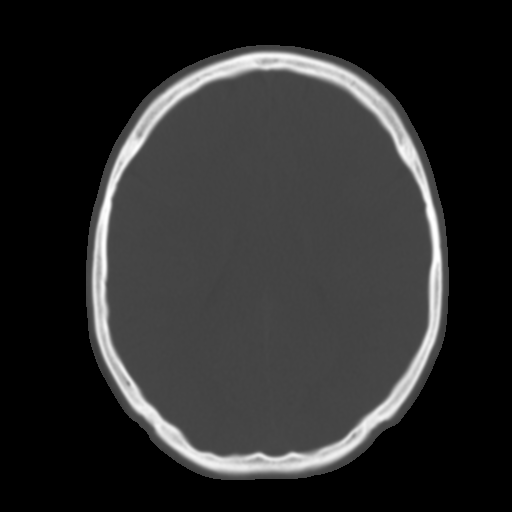
[im 18/29  brain]
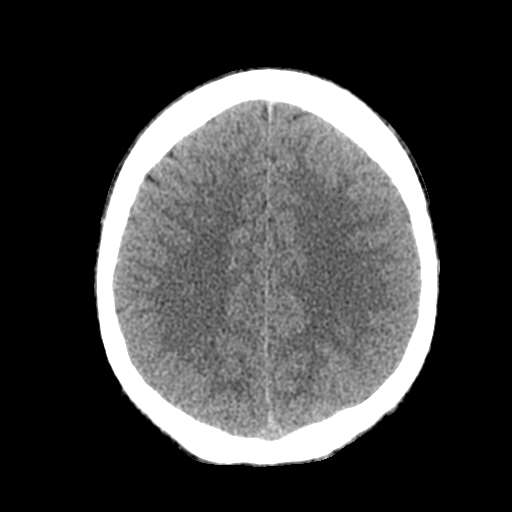
[im 21/29  brain]
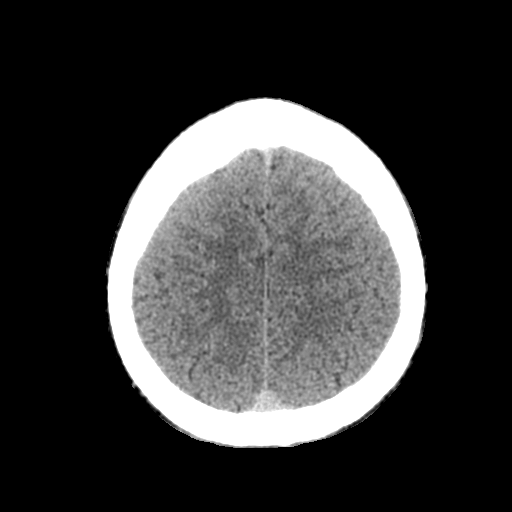
[im 24/29  brain]
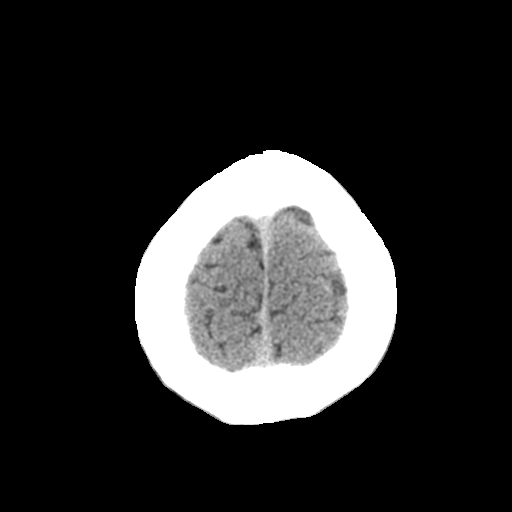
[im 27/29  brain]
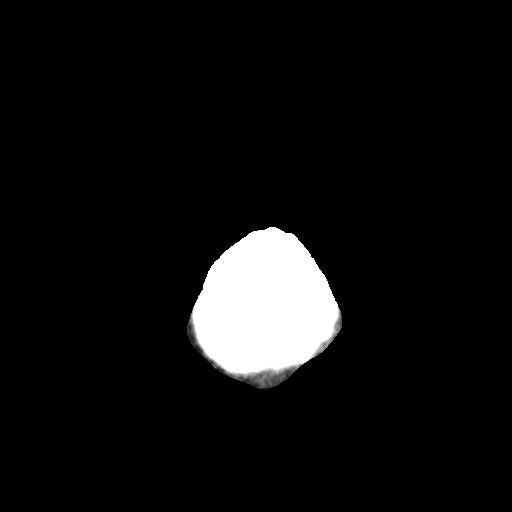
[im 27/29  bone]
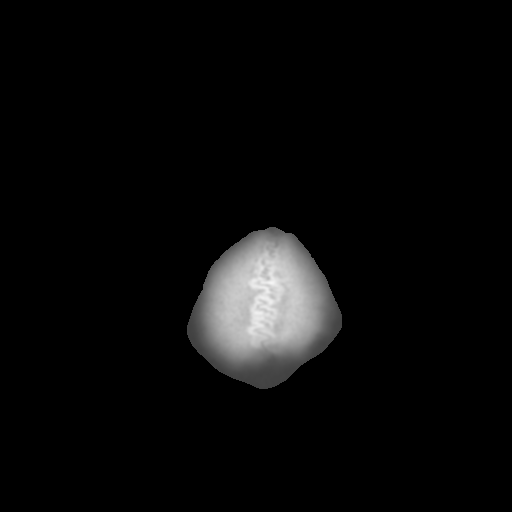

[Series 4: coronal soft tissue · coronal · 0.28mm/px · 3 of 66 slices shown]
[im 22/66  brain]
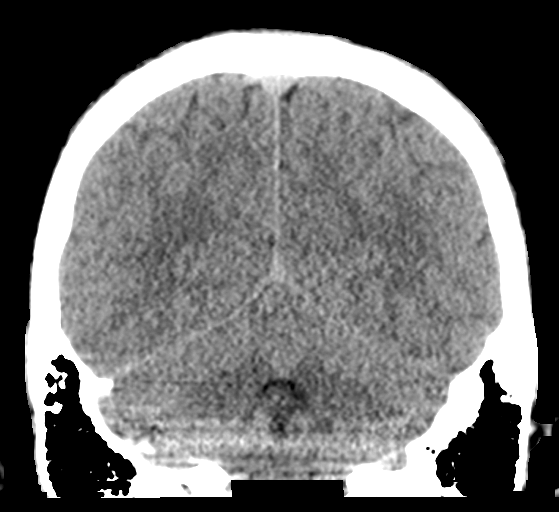
[im 29/66  brain]
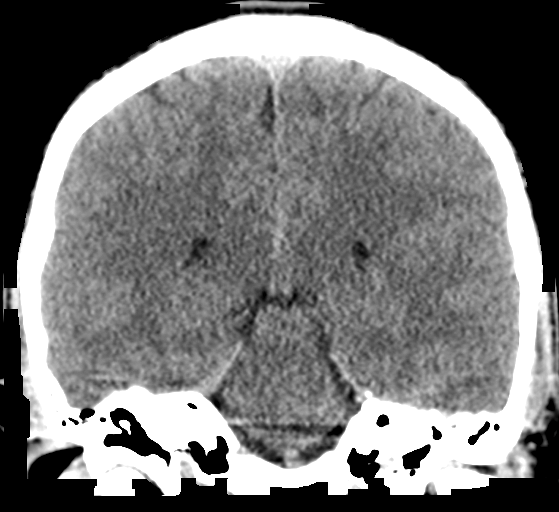
[im 37/66  brain]
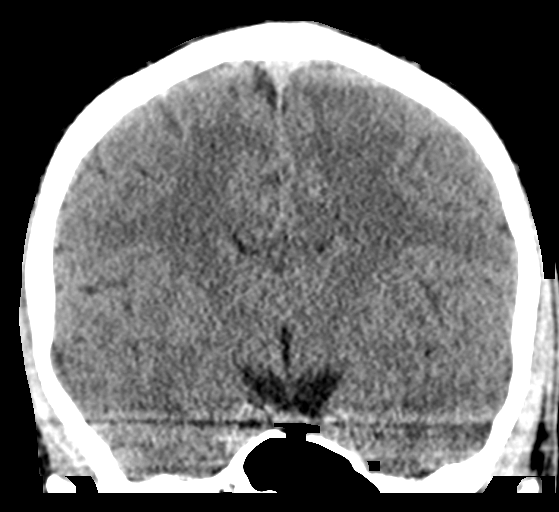

[Series 5: sagittal soft tissue · sagittal · 0.28mm/px · 3 of 53 slices shown]
[im 18/53  brain]
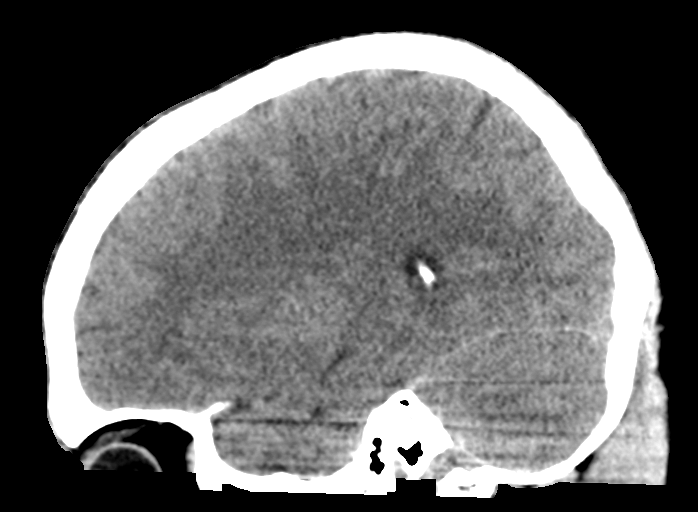
[im 27/53  brain]
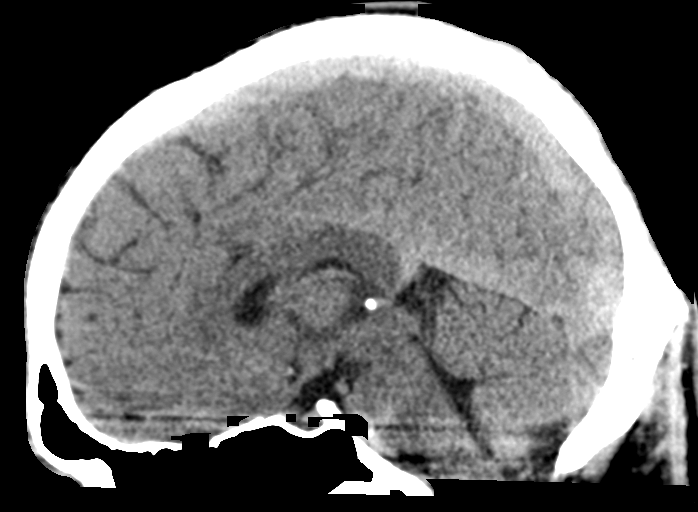
[im 35/53  brain]
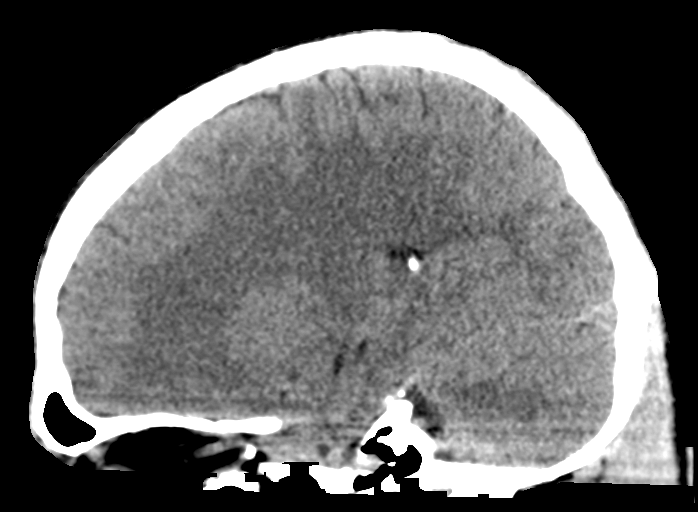

[15 of 46 positions shown; findings below may reference images not displayed]

FINDINGS: Brain: No evidence of parenchymal hemorrhage or extra-axial fluid
collection. No mass lesion, mass effect, or midline shift. No CT
evidence of acute infarction. Cerebral volume is age appropriate. No
ventriculomegaly.

Vascular: No acute abnormality.

Skull: No evidence of calvarial fracture.

Sinuses/Orbits: The visualized paranasal sinuses are essentially
clear.

Other:  The mastoid air cells are unopacified.
IMPRESSION: Negative head CT. No evidence of acute intracranial abnormality. No
evidence of calvarial fracture .

## 2021-04-22 ENCOUNTER — Other Ambulatory Visit: Payer: Self-pay

## 2021-04-22 ENCOUNTER — Emergency Department
Admission: EM | Admit: 2021-04-22 | Discharge: 2021-04-22 | Disposition: A | Discharge status: Home / Self Care | Payer: Medicaid Other | Attending: Emergency Medicine | Admitting: Emergency Medicine

## 2021-04-22 ENCOUNTER — Encounter: Payer: Self-pay | Admitting: Emergency Medicine

## 2021-04-22 DIAGNOSIS — Z202 Contact with and (suspected) exposure to infections with a predominantly sexual mode of transmission: Secondary | ICD-10-CM | POA: Insufficient documentation

## 2021-04-22 LAB — CHLAMYDIA/NGC RT PCR (ARMC ONLY)
Chlamydia Tr: NOT DETECTED
N gonorrhoeae: NOT DETECTED

## 2021-04-22 MED ORDER — METRONIDAZOLE 500 MG PO TABS: 2000.0000 mg | ORAL_TABLET | Freq: Once | ORAL | 0 refills | Status: AC

## 2021-04-22 NOTE — ED Triage Notes (Signed)
Pt to ED via POV with c/o being in contact with someone that had Tric no symptoms, try calling to get an appointment with [facility] Department but there was nothing available.  ?

## 2021-04-22 NOTE — ED Provider Notes (Signed)
? ?[facility] ?Provider Note ? ? ? Event Date/Time  ? First MD Initiated Contact with Patient 04/22/21 1041   ?  (approximate) ? ? ?History  ? ?SEXUALLY TRANSMITTED DISEASE ? ? ?HPI ? ?Shane Griffith is a 31 y.o. male presents emergency department with positive exposure to an STD.  Patient states that he was told by a partner that they have trichomoniasis.  Patient denies any discharge from his penis.  No fever or chills. ? ?  ? ? ?Physical Exam  ? ?Triage Vital Signs: ?ED Triage Vitals  ?Enc Vitals Group  ?   BP 04/22/21 1028 137/83  ?   Pulse Rate 04/22/21 1028 73  ?   Resp 04/22/21 1028 (!) 1  ?   Temp 04/22/21 1028 98.3 ?F (36.8 ?C)  ?   Temp Source 04/22/21 1028 Oral  ?   SpO2 04/22/21 1028 100 %  ?   Weight 04/22/21 1032 185 lb (83.9 kg)  ?   Height 04/22/21 1032 6\' 2"  (1.88 m)  ?   Head Circumference --   ?   Peak Flow --   ?   Pain Score 04/22/21 1029 0  ?   Pain Loc --   ?   Pain Edu? --   ?   Excl. in GC? --   ? ? ?Most recent vital signs: ?Vitals:  ? 04/22/21 1028 04/22/21 1029  ?BP: 137/83   ?Pulse: 73   ?Resp: (!) 1 18  ?Temp: 98.3 ?F (36.8 ?C)   ?SpO2: 100%   ? ? ? ?General: Awake, no distress.   ?CV:  Good peripheral perfusion. regular rate and  rhythm ?Resp:  Normal effort.  ?Abd:  No distention.   ?Other:    ? ? ?ED Results / Procedures / Treatments  ? ?Labs ?(all labs ordered are listed, but only abnormal results are displayed) ?Labs Reviewed  ?CHLAMYDIA/NGC RT PCR ([facility] ONLY)            ? ? ? ?EKG ? ? ? ? ?RADIOLOGY ? ? ? ? ?PROCEDURES: ? ? ?Procedures ? ? ?MEDICATIONS ORDERED IN ED: ?Medications - No data to display ? ? ?IMPRESSION / MDM / ASSESSMENT AND PLAN / ED COURSE  ?I reviewed the triage vital signs and the nursing notes. ?             ?               ? ?Differential diagnosis includes, but is not limited to, trichomoniasis, gonorrhea/chlamydia ? ?Due to the positive exposure we will go ahead and treat the patient for trichomoniasis.  Added a gonorrhea/chlamydia  test.  He can review these on [facility] MyChart.  If positive he can go to the health department for treatment.  Patient is in agreement treatment plan.  He was given a prescription for Flagyl 2000 mg p.o. once.  He was encouraged to eat prior to taking this medication as it will make him nauseated.  He was discharged stable condition. ? ? ? ? ?  ? ? ?FINAL CLINICAL IMPRESSION(S) / ED DIAGNOSES  ? ?Final diagnoses:  ?STD exposure  ? ? ? ?Rx / DC Orders  ? ?ED Discharge Orders   ? ?      Ordered  ?  metroNIDAZOLE (FLAGYL) 500 MG tablet   Once       ? 04/22/21 1123  ? ?  ?  ? ?  ? ? ? ?Note:  This document was  prepared using Systems analyst and may include unintentional dictation errors. ? ?  ?Versie Starks, PA-C ?04/22/21 1132 ? ?  ?Lavonia Drafts, MD ?04/22/21 1419 ? ?

## 2021-04-22 NOTE — Discharge Instructions (Signed)
Follow-up with [facility] department.  If your gonorrhea or chlamydia test are positive you will need further treatment.  I would recommend following up at the [facility] department for this.  Do not have sex until everyone that is been exposed to this is been treated.  Use condoms to prevent infections. ?

## 2021-05-17 ENCOUNTER — Encounter: Payer: Self-pay | Admitting: Emergency Medicine

## 2021-05-17 ENCOUNTER — Emergency Department
Admission: EM | Admit: 2021-05-17 | Discharge: 2021-05-17 | Disposition: A | Discharge status: Home / Self Care | Payer: Medicaid Other | Attending: Emergency Medicine | Admitting: Emergency Medicine

## 2021-05-17 ENCOUNTER — Other Ambulatory Visit: Payer: Self-pay

## 2021-05-17 DIAGNOSIS — U071 COVID-19: Secondary | ICD-10-CM | POA: Insufficient documentation

## 2021-05-17 DIAGNOSIS — W57XXXA Bitten or stung by nonvenomous insect and other nonvenomous arthropods, initial encounter: Secondary | ICD-10-CM | POA: Insufficient documentation

## 2021-05-17 DIAGNOSIS — B349 Viral infection, unspecified: Secondary | ICD-10-CM | POA: Diagnosis not present

## 2021-05-17 DIAGNOSIS — S30861A Insect bite (nonvenomous) of abdominal wall, initial encounter: Secondary | ICD-10-CM | POA: Diagnosis not present

## 2021-05-17 DIAGNOSIS — S3991XA Unspecified injury of abdomen, initial encounter: Secondary | ICD-10-CM | POA: Diagnosis present

## 2021-05-17 LAB — RESP PANEL BY RT-PCR (FLU A&B, COVID) ARPGX2
Influenza A by PCR: NEGATIVE
Influenza B by PCR: NEGATIVE
SARS Coronavirus 2 by RT PCR: POSITIVE — AB

## 2021-05-17 MED ORDER — DOXYCYCLINE HYCLATE 100 MG PO TABS
100.0000 mg | ORAL_TABLET | Freq: Once | ORAL | Status: AC
  Administered 2021-05-17: 100 mg via ORAL
  Filled 2021-05-17: qty 1

## 2021-05-17 MED ORDER — FLUTICASONE PROPIONATE 50 MCG/ACT NA SUSP: 1.0000 | Freq: Two times a day (BID) | NASAL | 0 refills | Status: AC

## 2021-05-17 MED ORDER — IBUPROFEN 600 MG PO TABS
600.0000 mg | ORAL_TABLET | Freq: Once | ORAL | Status: AC
  Administered 2021-05-17: 600 mg via ORAL
  Filled 2021-05-17: qty 1

## 2021-05-17 MED ORDER — ONDANSETRON 4 MG PO TBDP: 4.0000 mg | ORAL_TABLET | Freq: Three times a day (TID) | ORAL | 0 refills | Status: DC | PRN

## 2021-05-17 MED ORDER — DOXYCYCLINE HYCLATE 100 MG PO TABS: 100.0000 mg | ORAL_TABLET | Freq: Two times a day (BID) | ORAL | 0 refills | Status: AC

## 2021-05-17 MED ORDER — ACETAMINOPHEN 325 MG PO TABS
650.0000 mg | ORAL_TABLET | Freq: Once | ORAL | Status: AC
  Administered 2021-05-17: 650 mg via ORAL
  Filled 2021-05-17: qty 2

## 2021-05-17 MED ORDER — PSEUDOEPH-BROMPHEN-DM 30-2-10 MG/5ML PO SYRP: 10.0000 mL | ORAL_SOLUTION | Freq: Four times a day (QID) | ORAL | 0 refills | Status: DC | PRN

## 2021-05-17 NOTE — ED Provider Notes (Signed)
? ?[facility] ?Provider Note ? ?Patient Contact: 6:06 PM (approximate) ? ? ?History  ? ?Cough, Nasal Congestion, and Generalized Body Aches ? ? ?HPI ? ?Shane Griffith is a 31 y.o. male who presented to the emergency department with sudden onset of headache, congestion, body aches.  Patient states that he felt fine yesterday, suddenly this morning he felt like he had been hit by a truck.  Denies any known sick contacts.  He denies any difficulty breathing or chest pain.  GI symptoms of nausea, vomiting, diarrhea.  Patient arrived febrile but has not taken any antipyretics prior to arrival.  Incidentally patient mentions that last week he was bitten by a tick.  He does not believe it was on his body for longer than 72 hours but cannot be sure as it was found in his umbilicus.  Patient has had an erythematous mark in his umbilicus but is confident that he got the entire tick.  Patient denies a rash at this time. ?  ? ? ?Physical Exam  ? ?Triage Vital Signs: ?ED Triage Vitals  ?Enc Vitals Group  ?   BP 05/17/21 1757 119/75  ?   Pulse Rate 05/17/21 1757 (!) 114  ?   Resp 05/17/21 1757 18  ?   Temp 05/17/21 1757 (!) 101.2 ?F (38.4 ?C)  ?   Temp Source 05/17/21 1757 Oral  ?   SpO2 05/17/21 1757 100 %  ?   Weight 05/17/21 1754 185 lb (83.9 kg)  ?   Height 05/17/21 1754 6\' 1"  (1.854 m)  ?   Head Circumference --   ?   Peak Flow --   ?   Pain Score 05/17/21 1754 7  ?   Pain Loc --   ?   Pain Edu? --   ?   Excl. in [place]? --   ? ? ?Most recent vital signs: ?Vitals:  ? 05/17/21 1757  ?BP: 119/75  ?Pulse: (!) 114  ?Resp: 18  ?Temp: (!) 101.2 ?F (38.4 ?C)  ?SpO2: 100%  ? ? ? ?General: Alert and in no acute distress. ?Eyes:  PERRL. EOMI. ?ENT: ?     Ears: EACs and TMs unremarkable bilaterally. ?     Nose: Mild clear congestion/rhinnorhea. ?     Mouth/Throat: Mucous membranes are moist. ?Neck: No stridor. No cervical spine tenderness to palpation. ?Hematological/Lymphatic/Immunilogical: Scattered anterior  cervical cervical lymphadenopathy. ?Cardiovascular:  Good peripheral perfusion ?Respiratory: Normal respiratory effort without tachypnea or retractions. Lungs CTAB. ?Gastrointestinal: Visualization of the abdominal wall reveals an erythematous lesion in the umbilicus itself.  Area is nontender to palpation.  No surrounding rash.  Bowel sounds ?4 quadrants. Soft and nontender to palpation. No guarding or rigidity. No palpable masses. No distention. No CVA tenderness. ?Musculoskeletal: Full range of motion to all extremities.  ?Neurologic:  No gross focal neurologic deficits are appreciated.  ?Skin:   No rash noted ?Other: ? ? ?ED Results / Procedures / Treatments  ? ?Labs ?(all labs ordered are listed, but only abnormal results are displayed) ?Labs Reviewed  ?RESP PANEL BY RT-PCR (FLU A&B, COVID) ARPGX2  ? ? ? ?EKG ? ? ? ? ?RADIOLOGY ? ? ?No results found. ? ?PROCEDURES: ? ?Critical Care performed: No ? ?Procedures ? ? ?MEDICATIONS ORDERED IN ED: ?Medications  ?acetaminophen (TYLENOL) tablet 650 mg (650 mg Oral Given 05/17/21 1759)  ?ibuprofen (ADVIL) tablet 600 mg (600 mg Oral Given 05/17/21 1820)  ?doxycycline (VIBRA-TABS) tablet 100 mg (100 mg Oral Given 05/17/21 1820)  ? ? ? ?  IMPRESSION / MDM / ASSESSMENT AND PLAN / ED COURSE  ?I reviewed the triage vital signs and the nursing notes. ?             ?               ? ?Differential diagnosis includes, but is not limited to, viral illness, COVID, flu, Lyme's disease, [place] spotted fever ? ? ?Patient's diagnosis is consistent with viral illness.  Patient presents emergency department complaining of headache, congestion, body aches.  Sudden onset this morning.  Symptoms and physical exam are consistent with a viral illness however patient does have a recent tick bite as well.  At this time I will treat empirically for Lyme's disease with doxycycline as well as prescribe other symptom control medications for the patient for suspected viral illness.  COVID and flu  test are still pending at this time but patient prefers to follow-up results on MyChart and sent weight.  I feel this is reasonable.  He did arrive febrile and tachycardic but was not hypotensive, has no altered mental status and I have a low suspicion for sepsis at this time.  We did discuss further labs but patient prefers empiric treatment at this time and if symptoms worsen he will return to the emergency department... Patient is given ED precautions to return to the ED for any worsening or new symptoms. ? ? ? ?  ? ? ?FINAL CLINICAL IMPRESSION(S) / ED DIAGNOSES  ? ?Final diagnoses:  ?Viral illness  ?Tick bite of abdominal wall, initial encounter  ? ? ? ?Rx / DC Orders  ? ?ED Discharge Orders   ? ?      Ordered  ?  doxycycline (VIBRA-TABS) 100 MG tablet  2 times daily       ? 05/17/21 1821  ?  fluticasone (FLONASE) 50 MCG/ACT nasal spray  2 times daily       ? 05/17/21 1821  ?  brompheniramine-pseudoephedrine-DM 30-2-10 MG/5ML syrup  4 times daily PRN       ? 05/17/21 1821  ?  ondansetron (ZOFRAN-ODT) 4 MG disintegrating tablet  Every 8 hours PRN       ? 05/17/21 1821  ? ?  ?  ? ?  ? ? ? ?Note:  This document was prepared using Dragon voice recognition software and may include unintentional dictation errors. ?  ?[name] ?05/17/21 1822 ? ?  ?Naaman Plummer, MD ?05/24/21 1259 ? ?

## 2021-05-17 NOTE — ED Triage Notes (Signed)
Pt via POV from home. Pt c/o runny nose, congestion, vomiting, and generalized body aches that started yesterday. Pt is A&OX4 and NAD.  ?

## 2021-10-25 ENCOUNTER — Emergency Department
Admission: EM | Admit: 2021-10-25 | Discharge: 2021-10-25 | Disposition: A | Discharge status: Home / Self Care | Payer: Medicaid Other | Attending: Emergency Medicine | Admitting: Emergency Medicine

## 2021-10-25 ENCOUNTER — Other Ambulatory Visit: Payer: Self-pay

## 2021-10-25 DIAGNOSIS — K0889 Other specified disorders of teeth and supporting structures: Secondary | ICD-10-CM | POA: Diagnosis present

## 2021-10-25 MED ORDER — MELOXICAM 15 MG PO TABS: 15.0000 mg | ORAL_TABLET | Freq: Every day | ORAL | 0 refills | Status: AC

## 2021-10-25 MED ORDER — PENICILLIN V POTASSIUM 500 MG PO TABS: 500.0000 mg | ORAL_TABLET | Freq: Four times a day (QID) | ORAL | 0 refills | Status: AC

## 2021-10-25 NOTE — ED Provider Notes (Signed)
[facility] Provider Note    Event Date/Time   First MD Initiated Contact with Patient 10/25/21 2141     (approximate)   History   Chief Complaint Dental Problem   HPI Shane Griffith is a 31 y.o. male, history of seizures, presents emergency department for evaluation of dental pain.  He states that he had a tooth pulled last week, which overall did not have any immediate complications.  However, as the days have progressed, he has had increased pain along the area.  Reports getting food stuck in the socket.  He was able to get some of it out, but was unsure if he got all of it out.  He states that he is concerned for infection as well.  Endorses mild discharge with a foul taste.  Denies fever/chills, myalgias, throat swelling, neck pain, tongue swelling, bleeding, chest pain, shortness of breath, abdominal pain, nausea/vomiting, or dizziness/lightheadedness.  History Limitations: No limitations.        Physical Exam  Triage Vital Signs: ED Triage Vitals [10/25/21 2122]  Enc Vitals Group     BP 125/83     Pulse Rate 71     Resp 18     Temp 98.3 F (36.8 C)     Temp src      SpO2 97 %     Weight 185 lb (83.9 kg)     Height 6\' 1"  (1.854 m)     Head Circumference      Peak Flow      Pain Score 5     Pain Loc      Pain Edu?      Excl. in [place]?     Most recent vital signs: Vitals:   10/25/21 2122  BP: 125/83  Pulse: 71  Resp: 18  Temp: 98.3 F (36.8 C)  SpO2: 97%    General: Awake, NAD.  Skin: Warm, dry. No rashes or lesions.  Eyes: PERRL. Conjunctivae normal.  CV: Good peripheral perfusion.  Resp: Normal effort.  Abd: Soft, non-tender. No distention.  Neuro: At baseline. No gross neurological deficits.  Musculoskeletal: Normal ROM of all extremities.  Focused Exam: Multiple dental caries, overall poor dentition across all teeth.  Tooth extraction site along tooth #7.  Appears to be some white foreign material inside the socket.  No  active bleeding or discharge.  No obvious abscess in the periapical region.  Gingiva normal.  Physical Exam    ED Results / Procedures / Treatments  Labs (all labs ordered are listed, but only abnormal results are displayed) Labs Reviewed - No data to display   EKG N/A.    RADIOLOGY  ED Provider Interpretation: N/A.  No results found.  PROCEDURES:  Critical Care performed: N/A.  Procedures    MEDICATIONS ORDERED IN ED: Medications - No data to display   IMPRESSION / MDM / [place] / ED COURSE  I reviewed the triage vital signs and the nursing notes.                              Differential diagnosis includes, but is not limited to, dental caries, gingivitis, odontogenic infection, periapical abscess, dry socket.  Assessment/Plan Patient presents with dental pain from a recent tooth extraction site 1 week prior.  I was able to irrigate the socket extensively to remove much of the foreign material.  Blood clot still appears to be in place.  No  need for packing at this time.  No active signs of infection at this time, however given his endorsement of increased pain in foul taste, will cover him for possible odontogenic infection.  Provided him with a prescription for penicillin and meloxicam.  Recommend they follow-up back up with his dentist.  He was amenable to this plan.  Will discharge.  Provided the patient with anticipatory guidance, return precautions, and educational material. Encouraged the patient to return to the emergency department at any time if they begin to experience any new or worsening symptoms. Patient expressed understanding and agreed with the plan.   Patient's presentation is most consistent with acute, uncomplicated illness.       FINAL CLINICAL IMPRESSION(S) / ED DIAGNOSES   Final diagnoses:  Pain, dental     Rx / DC Orders   ED Discharge Orders          Ordered    penicillin v potassium (VEETID) 500 MG tablet  4 times  daily        10/25/21 2239    meloxicam (MOBIC) 15 MG tablet  Daily        10/25/21 2239             Note:  This document was prepared using Dragon voice recognition software and may include unintentional dictation errors.   Varney Daily, [place] 10/25/21 2241    Phineas Semen, MD 10/25/21 2252

## 2021-10-25 NOTE — ED Triage Notes (Signed)
PT had a tooth pulled in the last week and sts it is painful. Wants to make sure it isn't infected

## 2021-10-25 NOTE — Discharge Instructions (Addendum)
-  The full course of the penicillin as prescribed.  -You may take meloxicam as needed for pain.  You may additionally add acetaminophen as needed.  -Recommend rinsing your mouth 2-3 times a day to ensure no food gets stuck in the cavity.  -Please follow-up with your dentist within the next week.  -Return to the emergency department anytime if you begin to experience any new or worsening symptoms.

## 2023-02-13 ENCOUNTER — Other Ambulatory Visit: Payer: Self-pay

## 2023-02-13 ENCOUNTER — Emergency Department
Admission: EM | Admit: 2023-02-13 | Discharge: 2023-02-13 | Discharge status: Against Medical Advice | Payer: Medicare Other | Attending: Emergency Medicine | Admitting: Emergency Medicine

## 2023-02-13 ENCOUNTER — Emergency Department (HOSPITAL_COMMUNITY)
Admission: EM | Admit: 2023-02-13 | Discharge: 2023-02-13 | Disposition: A | Discharge status: Home / Self Care | Payer: Medicare Other | Attending: Emergency Medicine | Admitting: Emergency Medicine

## 2023-02-13 DIAGNOSIS — Z20822 Contact with and (suspected) exposure to covid-19: Secondary | ICD-10-CM | POA: Diagnosis not present

## 2023-02-13 DIAGNOSIS — J101 Influenza due to other identified influenza virus with other respiratory manifestations: Secondary | ICD-10-CM | POA: Diagnosis not present

## 2023-02-13 DIAGNOSIS — R509 Fever, unspecified: Secondary | ICD-10-CM | POA: Diagnosis present

## 2023-02-13 DIAGNOSIS — Z5321 Procedure and treatment not carried out due to patient leaving prior to being seen by health care provider: Secondary | ICD-10-CM | POA: Insufficient documentation

## 2023-02-13 LAB — RESP PANEL BY RT-PCR (RSV, FLU A&B, COVID)  RVPGX2
Influenza A by PCR: POSITIVE — AB
Influenza B by PCR: NEGATIVE
Resp Syncytial Virus by PCR: NEGATIVE
SARS Coronavirus 2 by RT PCR: NEGATIVE

## 2023-02-13 LAB — GROUP A STREP BY PCR: Group A Strep by PCR: NOT DETECTED

## 2023-02-13 MED ORDER — OSELTAMIVIR PHOSPHATE 75 MG PO CAPS
75.0000 mg | ORAL_CAPSULE | Freq: Once | ORAL | Status: AC
  Administered 2023-02-13: 75 mg via ORAL
  Filled 2023-02-13: qty 1

## 2023-02-13 MED ORDER — OSELTAMIVIR PHOSPHATE 75 MG PO CAPS: 75.0000 mg | ORAL_CAPSULE | Freq: Two times a day (BID) | ORAL | 0 refills | Status: DC

## 2023-02-13 MED ORDER — IBUPROFEN 800 MG PO TABS
800.0000 mg | ORAL_TABLET | Freq: Once | ORAL | Status: AC
  Administered 2023-02-13: 800 mg via ORAL
  Filled 2023-02-13: qty 1

## 2023-02-13 NOTE — ED Triage Notes (Signed)
Pt with fever since last night, Significant other is sick as well.  + coughing.

## 2023-02-13 NOTE — ED Notes (Signed)
Patients mother up to first nurse asking for fever medication for patient. Patients temp rechecked. Temp 98.7oral. Documented in chart

## 2023-02-13 NOTE — ED Triage Notes (Signed)
Pt to ed from home via POV for fever and flu like symptoms x 2 days. Pt has not taken any OTC tylenol at home for same. Pt is caox4, in no acute distress in triage.

## 2023-02-13 NOTE — ED Provider Notes (Signed)
 [place] EMERGENCY DEPARTMENT AT [facility] Provider Note   CSN: [id number] Arrival date & time: 02/13/23  1814     History Chief Complaint  Patient presents with   Fever    Shane Griffith is a 33 y.o. male.  Patient presents to the emergency department concerns of a fever.  States that he began to feel sick last night.  Was at [facility] earlier but left before being seen due to the wait time.  He was tested for flu at that time and was positive.  Has not been started on any antiviral medications.  He denies any chest pain shortness of breath.  No nausea vomiting or diarrhea.   Fever      Home Medications Prior to Admission medications   Medication Sig Start Date End Date Taking? Authorizing Provider  oseltamivir (TAMIFLU) 75 MG capsule Take 1 capsule (75 mg total) by mouth every 12 (twelve) hours. 02/13/23  Yes Maryanna Shape A, PA-C  brompheniramine-pseudoephedrine-DM 30-2-10 MG/5ML syrup Take 10 mLs by mouth 4 (four) times daily as needed. 05/17/21   Cuthriell, Delorise Royals, PA-C  fluticasone (FLONASE) 50 MCG/ACT nasal spray Place 1 spray into both nostrils 2 (two) times daily. 05/17/21   Cuthriell, Delorise Royals, PA-C  hydrOXYzine (ATARAX/VISTARIL) 50 MG tablet Take 1 tablet (50 mg total) by mouth 3 (three) times daily as needed for itching. 04/07/15   Joni Reining, PA-C  ibuprofen (ADVIL,MOTRIN) 600 MG tablet Take 1 tablet (600 mg total) every 8 (eight) hours as needed by mouth. 12/05/16   Merrily Brittle, MD  ondansetron (ZOFRAN-ODT) 4 MG disintegrating tablet Take 1 tablet (4 mg total) by mouth every 8 (eight) hours as needed for nausea or vomiting. 05/17/21   Cuthriell, Delorise Royals, PA-C      Allergies    Patient has no known allergies.    Review of Systems   Review of Systems  Constitutional:  Positive for fever.  All other systems reviewed and are negative.   Physical Exam Updated Vital Signs BP 98/69 (BP Location: Right Arm)   Pulse (!)  119   Temp (!) 100.9 F (38.3 C) (Oral)   Resp (!) 22   Ht 6\' 1"  (1.854 m)   Wt 87.5 kg   SpO2 100%   BMI 25.46 kg/m  Physical Exam Vitals and nursing note reviewed.  Constitutional:      General: He is not in acute distress.    Appearance: Normal appearance. He is well-developed. He is not ill-appearing.  HENT:     Head: Normocephalic and atraumatic.  Eyes:     Conjunctiva/sclera: Conjunctivae normal.  Cardiovascular:     Rate and Rhythm: Normal rate and regular rhythm.     Heart sounds: No murmur heard. Pulmonary:     Effort: Pulmonary effort is normal. No respiratory distress.     Breath sounds: Normal breath sounds. No wheezing or rales.  Abdominal:     Palpations: Abdomen is soft.     Tenderness: There is no abdominal tenderness.  Musculoskeletal:        General: No swelling.     Cervical back: Neck supple.  Skin:    General: Skin is warm and dry.     Capillary Refill: Capillary refill takes less than 2 seconds.  Neurological:     Mental Status: He is alert.  Psychiatric:        Mood and Affect: Mood normal.     ED Results / Procedures / Treatments  Labs (all labs ordered are listed, but only abnormal results are displayed) Labs Reviewed - No data to display  EKG None  Radiology No results found.  Procedures Procedures    Medications Ordered in ED Medications  oseltamivir (TAMIFLU) capsule 75 mg (has no administration in time range)  ibuprofen (ADVIL) tablet 800 mg (has no administration in time range)    ED Course/ Medical Decision Making/ A&P                                 Medical Decision Making Risk Prescription drug management.   This patient presents to the ED for concern of fever.  Differential diagnosis includes COVID-19, influenza, RSV, pneumonia    Additional history obtained:  Additional history obtained from respiratory panel from earlier today is positive for influenza A   Lab Tests:  I Ordered, and personally  interpreted labs.  The pertinent results include: Labs from earlier today show patient positive for influenza A   Medicines ordered and prescription drug management:  I ordered medication including Tamiflu, ibuprofen for influenza, fever Reevaluation of the patient after these medicines showed that the patient stayed the same I have reviewed the patients home medicines and have made adjustments as needed   Problem List / ED Course:  Patient without significant past medical history here with concerns of a fever.  States has been feeling unwell since last night.  States significant other is sick with similar symptoms.  Primarily concerned about general malaise and cough.  Was at [facility] earlier and had respiratory panel collected which was positive for influenza but patient did not stay due to long wait time. Physical exam is unremarkable.  Lung sounds are clear to auscultation bilaterally.  There is no evidence of respiratory distress.  Patient is febrile so a dose of ibuprofen is ordered.  Discussed possible treatment with antivirals versus symptom management at home.  Patient will prefer antiviral medications.  Will give initial dose here in the emergency department prescription for Tamiflu sent to pharmacy for continuation for the next 5 days.  Discussed return precautions.  Patient otherwise stable for outpatient follow-up.   Final Clinical Impression(s) / ED Diagnoses Final diagnoses:  Influenza A    Rx / DC Orders ED Discharge Orders          Ordered    oseltamivir (TAMIFLU) 75 MG capsule  Every 12 hours        02/13/23 2006              Salomon Mast 02/13/23 2007    Zammit, Joseph, MD 02/15/23 1317

## 2023-02-13 NOTE — Discharge Instructions (Addendum)
You are seen in the emergency department today with concerns of a fever.  He tested positive for influenza A.  He was started on Tamiflu.  Please try to stay hydrated as best as you can.  If any new or worsening symptoms develop, recommend emergency department.  Otherwise please follow-up with your primary care provider.

## 2023-02-15 ENCOUNTER — Other Ambulatory Visit: Payer: Self-pay

## 2023-02-15 ENCOUNTER — Encounter (HOSPITAL_COMMUNITY): Payer: Self-pay

## 2023-02-15 ENCOUNTER — Emergency Department (HOSPITAL_COMMUNITY)
Admission: EM | Admit: 2023-02-15 | Discharge: 2023-02-15 | Disposition: A | Discharge status: Home / Self Care | Payer: Medicare Other | Attending: Emergency Medicine | Admitting: Emergency Medicine

## 2023-02-15 DIAGNOSIS — R000 Tachycardia, unspecified: Secondary | ICD-10-CM | POA: Diagnosis not present

## 2023-02-15 DIAGNOSIS — R112 Nausea with vomiting, unspecified: Secondary | ICD-10-CM | POA: Insufficient documentation

## 2023-02-15 DIAGNOSIS — R55 Syncope and collapse: Secondary | ICD-10-CM

## 2023-02-15 DIAGNOSIS — R197 Diarrhea, unspecified: Secondary | ICD-10-CM | POA: Diagnosis not present

## 2023-02-15 DIAGNOSIS — E860 Dehydration: Secondary | ICD-10-CM

## 2023-02-15 LAB — COMPREHENSIVE METABOLIC PANEL
ALT: 46 U/L — ABNORMAL HIGH (ref 0–44)
AST: 53 U/L — ABNORMAL HIGH (ref 15–41)
Albumin: 4.3 g/dL (ref 3.5–5.0)
Alkaline Phosphatase: 73 U/L (ref 38–126)
Anion gap: 16 — ABNORMAL HIGH (ref 5–15)
BUN: 15 mg/dL (ref 6–20)
CO2: 17 mmol/L — ABNORMAL LOW (ref 22–32)
Calcium: 9.4 mg/dL (ref 8.9–10.3)
Chloride: 101 mmol/L (ref 98–111)
Creatinine, Ser: 1.13 mg/dL (ref 0.61–1.24)
GFR, Estimated: >60 mL/min (ref >60)
Glucose, Bld: 116 mg/dL — ABNORMAL HIGH (ref 70–99)
Potassium: 3.7 mmol/L (ref 3.5–5.1)
Sodium: 134 mmol/L — ABNORMAL LOW (ref 135–145)
Total Bilirubin: 0.7 mg/dL (ref 0.0–1.2)
Total Protein: 7.6 g/dL (ref 6.5–8.1)

## 2023-02-15 LAB — LIPASE, BLOOD: Lipase: 24 U/L (ref 11–51)

## 2023-02-15 LAB — D-DIMER, QUANTITATIVE: D-Dimer, Quant: 0.39 ug{FEU}/mL (ref 0.00–0.50)

## 2023-02-15 LAB — CBC
HCT: 48.6 % (ref 39.0–52.0)
Hemoglobin: 17.4 g/dL — ABNORMAL HIGH (ref 13.0–17.0)
MCH: 29.5 pg (ref 26.0–34.0)
MCHC: 35.8 g/dL (ref 30.0–36.0)
MCV: 82.4 fL (ref 80.0–100.0)
Platelets: 219 10*3/uL (ref 150–400)
RBC: 5.9 MIL/uL — ABNORMAL HIGH (ref 4.22–5.81)
RDW: 13.2 % (ref 11.5–15.5)
WBC: 8.4 10*3/uL (ref 4.0–10.5)
nRBC: 0 % (ref 0.0–0.2)

## 2023-02-15 MED ORDER — SODIUM CHLORIDE 0.9 % IV BOLUS
1000.0000 mL | Freq: Once | INTRAVENOUS | Status: AC
  Administered 2023-02-15: 1000 mL via INTRAVENOUS

## 2023-02-15 MED ORDER — ONDANSETRON HCL 4 MG PO TABS: 4.0000 mg | ORAL_TABLET | Freq: Four times a day (QID) | ORAL | 0 refills | Status: DC

## 2023-02-15 MED ORDER — PROCHLORPERAZINE EDISYLATE 10 MG/2ML IJ SOLN
10.0000 mg | Freq: Once | INTRAMUSCULAR | Status: AC
  Administered 2023-02-15: 10 mg via INTRAVENOUS
  Filled 2023-02-15: qty 2

## 2023-02-15 MED ORDER — ONDANSETRON 4 MG PO TBDP: 4.0000 mg | ORAL_TABLET | Freq: Once | ORAL | Status: DC

## 2023-02-15 MED ORDER — DIPHENHYDRAMINE HCL 50 MG/ML IJ SOLN
12.5000 mg | Freq: Once | INTRAMUSCULAR | Status: AC
  Administered 2023-02-15: 12.5 mg via INTRAVENOUS
  Filled 2023-02-15: qty 1

## 2023-02-15 MED ORDER — ONDANSETRON HCL 4 MG/2ML IJ SOLN
4.0000 mg | Freq: Once | INTRAMUSCULAR | Status: AC
  Filled 2023-02-15: qty 2

## 2023-02-15 MED ORDER — ONDANSETRON HCL 4 MG/2ML IJ SOLN
INTRAMUSCULAR | Status: AC
  Administered 2023-02-15: 4 mg via INTRAVENOUS
  Filled 2023-02-15: qty 2

## 2023-02-15 NOTE — ED Notes (Signed)
Pt given ice chips to attempt PO challenge at this time.

## 2023-02-15 NOTE — Discharge Instructions (Signed)
Evaluation today was overall reassuring.  Recommend you follow-up with your PCP.  If you develop abdominal pain, fever, inability to tolerate fluid intake, persistent nausea vomiting or any other concerning symptom please return emerged part further evaluation.  I sent Zofran to your pharmacy to help with nausea and vomiting.  Please continue assertive hydration at home.

## 2023-02-15 NOTE — ED Notes (Addendum)
Pt failed PO challenge.

## 2023-02-15 NOTE — ED Notes (Signed)
ED Provider at bedside. 

## 2023-02-15 NOTE — ED Notes (Signed)
Pt given ice at this time to attempt PO challenge.

## 2023-02-15 NOTE — ED Provider Notes (Signed)
Accepted handoff at shift change from [facility], PA-C. Please see prior provider note for more detail.   Briefly: Patient is 33 y.o. presenting with nausea vomiting diarrhea in setting of recent flu diagnosis.  Denies abdominal pain.   The differential diagnosis of diarrhea includes but is not limited to Viral- norovirus/rotavirus; Bacterial-Campylobacter,Shigella, Salmonella, Escherichia coli, E. coli 0157:H7, Yersinia enterocolitica, Vibrio cholerae, Clostridium difficile. Parasitic- Giardia lamblia, Cryptosporidium,Entamoeba histolytica,Cyclospora, Microsporidium. Toxin- Staphylococcus aureus, Bacillus cereus. Noninfectious causes include GI Bleed, Appendicitis, Mesenteric Ischemia, Diverticulitis, Adrenal Crisis, Thyroid Storm, Toxicologic exposures, Antibiotic or drug-associated, inflammatory bowel disease.    Plan: Reassess, fluid challenge.  Likely discharge.    Physical Exam  BP (!) 105/56 (BP Location: Left Arm)   Pulse 90   Temp 98.4 F (36.9 C) (Oral)   Resp 12   Ht 6\' 1"  (1.854 m)   Wt 87.5 kg   SpO2 97%   BMI 25.45 kg/m   Physical Exam  Procedures  Procedures  ED Course / MDM   Clinical Course as of 02/15/23 1728  Mon Feb 15, 2023  1610 Patient vomiting again [AH]  1656 Diagnosed with the flu a few days ago. Came in with n/v/d. No abdominal pain/tenderness. Reassess, fluid challenge. Likely dc. [JR]    Clinical Course User Index [AH] Arthor Captain, PA-C [JR] Gareth Eagle, PA-C   Medical Decision Making Amount and/or Complexity of Data Reviewed Labs: ordered. ECG/medicine tests: ordered.  Risk Prescription drug management.   Patient tolerated second p.o. challenge without complication and stated that he was feeling much better and ready to be discharged.  Sent Zofran to his pharmacy.  Advised to follow-up PCP.  Discussed return precautions.  Vital stable.  Discharged good condition.       Gareth Eagle, PA-C 02/15/23 1731     Rondel Baton, MD 02/17/23 [phone]

## 2023-02-15 NOTE — ED Provider Notes (Signed)
[place] EMERGENCY DEPARTMENT AT [facility] Provider Note   CSN: [id number] Arrival date & time: 02/15/23  1329     History  Chief Complaint  Patient presents with   Emesis    Shane Griffith is a 33 y.o. male recently done diagnosed with influenza who presents emergency department with chief complaint of nausea vomiting and diarrhea since about 2:00 in the morning.  Patient reports innumerable episodes of vomiting and diarrhea overnight.  He was unable to hold down any fluids.  He has feelings of near syncope when he stands.  The history is provided by the patient. No language interpreter was used.  Emesis      Home Medications Prior to Admission medications   Medication Sig Start Date End Date Taking? Authorizing Provider  brompheniramine-pseudoephedrine-DM 30-2-10 MG/5ML syrup Take 10 mLs by mouth 4 (four) times daily as needed. 05/17/21   Cuthriell, Delorise Royals, PA-C  fluticasone (FLONASE) 50 MCG/ACT nasal spray Place 1 spray into both nostrils 2 (two) times daily. 05/17/21   Cuthriell, Delorise Royals, PA-C  hydrOXYzine (ATARAX/VISTARIL) 50 MG tablet Take 1 tablet (50 mg total) by mouth 3 (three) times daily as needed for itching. 04/07/15   Joni Reining, PA-C  ibuprofen (ADVIL,MOTRIN) 600 MG tablet Take 1 tablet (600 mg total) every 8 (eight) hours as needed by mouth. 12/05/16   Merrily Brittle, MD  ondansetron (ZOFRAN-ODT) 4 MG disintegrating tablet Take 1 tablet (4 mg total) by mouth every 8 (eight) hours as needed for nausea or vomiting. 05/17/21   Cuthriell, Delorise Royals, PA-C  oseltamivir (TAMIFLU) 75 MG capsule Take 1 capsule (75 mg total) by mouth every 12 (twelve) hours. 02/13/23   Smitty Knudsen, PA-C      Allergies    Patient has no known allergies.    Review of Systems   Review of Systems  Gastrointestinal:  Positive for vomiting.    Physical Exam Updated Vital Signs BP 116/88   Pulse 96   Temp 98.7 F (37.1 C) (Oral)   Resp 17   Ht 6\' 1"  (1.854  m)   Wt 87.5 kg   SpO2 98%   BMI 25.45 kg/m  Physical Exam Vitals and nursing note reviewed.  Constitutional:      General: He is not in acute distress.    Appearance: He is well-developed. He is not diaphoretic.  HENT:     Head: Normocephalic and atraumatic.  Eyes:     General: No scleral icterus.    Conjunctiva/sclera: Conjunctivae normal.  Cardiovascular:     Rate and Rhythm: Regular rhythm. Tachycardia present.     Heart sounds: Normal heart sounds.  Pulmonary:     Effort: Pulmonary effort is normal. No respiratory distress.     Breath sounds: Normal breath sounds.  Abdominal:     Palpations: Abdomen is soft.     Tenderness: There is no abdominal tenderness.  Musculoskeletal:     Cervical back: Normal range of motion and neck supple.  Skin:    General: Skin is warm and dry.  Neurological:     Mental Status: He is alert.  Psychiatric:        Behavior: Behavior normal.     ED Results / Procedures / Treatments   Labs (all labs ordered are listed, but only abnormal results are displayed) Labs Reviewed  COMPREHENSIVE METABOLIC PANEL  D-DIMER, QUANTITATIVE  LIPASE, BLOOD  RAPID URINE DRUG SCREEN, HOSP PERFORMED  URINALYSIS, ROUTINE W REFLEX MICROSCOPIC    EKG  None  Radiology No results found.  Procedures Procedures    Medications Ordered in ED Medications  ondansetron (ZOFRAN) injection 4 mg (4 mg Intravenous Given 02/15/23 1422)  sodium chloride 0.9 % bolus 1,000 mL (1,000 mLs Intravenous Bolus 02/15/23 1424)    ED Course/ Medical Decision Making/ A&P Clinical Course as of 02/15/23 1940  Mon Feb 15, 2023  1610 Patient vomiting again [AH]  1656 Diagnosed with the flu a few days ago. Came in with n/v/d. No abdominal pain/tenderness. Reassess, fluid challenge. Likely dc. [JR]    Clinical Course User Index [AH] Arthor Captain, PA-C [JR] Gareth Eagle, PA-C                                 Medical Decision Making Amount and/or Complexity of Data  Reviewed Labs: ordered. ECG/medicine tests: ordered.  Risk Prescription drug management.    33 year old male who presents emergency department for nausea vomiting and diarrhea after recent influenza diagnosis.   The differential diagnosis of diarrhea includes but is not limited to Viral- norovirus/rotavirus; Bacterial-Campylobacter,Shigella, Salmonella, Escherichia coli, E. coli 0157:H7, Yersinia enterocolitica, Vibrio cholerae, Clostridium difficile. Parasitic- Giardia lamblia, Cryptosporidium,Entamoeba histolytica,Cyclospora, Microsporidium. Toxin- Staphylococcus aureus, Bacillus cereus. Noninfectious causes include GI Bleed, Appendicitis, Mesenteric Ischemia, Diverticulitis, Adrenal Crisis, Thyroid Storm, Toxicologic exposures, Antibiotic or drug-associated, inflammatory bowel disease.   I reviewed patient's labs.  Patient's bicarb 17, anion gap of 16 likely due to GI loss.  Is a mild elevation in his AST and ALT of insignificant.  D-dimer negative, lipase within normal limits.  Patient CBC shows hemoglobin of 17.4 likely due to volume contraction.  EKG shows sinus rhythm at a rate of 93.  Medications  ondansetron (ZOFRAN) injection 4 mg (4 mg Intravenous Given 02/15/23 1422)  sodium chloride 0.9 % bolus 1,000 mL (0 mLs Intravenous Stopped 02/15/23 1549)  prochlorperazine (COMPAZINE) injection 10 mg (10 mg Intravenous Given 02/15/23 1613)  diphenhydrAMINE (BENADRYL) injection 12.5 mg (12.5 mg Intravenous Given 02/15/23 1612)   Given for dehydration, vomiting with improvement. PO challenge pending. Sign out given to PA Riki Sheer at shift handoff        Final Clinical Impression(s) / ED Diagnoses Final diagnoses:  None    Rx / DC Orders ED Discharge Orders     None         Arthor Captain, PA-C 02/15/23 1941    Pricilla Loveless, MD 02/18/23 [phone]

## 2023-02-15 NOTE — ED Triage Notes (Signed)
Pt arrived via POV from home c/o on-going symptoms from recent Dx of Influenza A. Pt reports he has been unable to eat or drink anything and is now having vision problems.

## 2023-02-15 NOTE — ED Notes (Addendum)
Pt successful on 2nd PO trial. EDP notified.

## 2023-12-26 ENCOUNTER — Other Ambulatory Visit: Payer: Self-pay

## 2023-12-26 ENCOUNTER — Emergency Department: Admission: EM | Admit: 2023-12-26 | Discharge: 2023-12-26 | Disposition: A | Discharge status: Home / Self Care

## 2023-12-26 ENCOUNTER — Encounter: Payer: Self-pay | Admitting: Emergency Medicine

## 2023-12-26 DIAGNOSIS — R112 Nausea with vomiting, unspecified: Secondary | ICD-10-CM | POA: Insufficient documentation

## 2023-12-26 DIAGNOSIS — R197 Diarrhea, unspecified: Secondary | ICD-10-CM | POA: Insufficient documentation

## 2023-12-26 DIAGNOSIS — D72829 Elevated white blood cell count, unspecified: Secondary | ICD-10-CM | POA: Diagnosis not present

## 2023-12-26 LAB — RESP PANEL BY RT-PCR (RSV, FLU A&B, COVID)  RVPGX2
Influenza A by PCR: NEGATIVE
Influenza B by PCR: NEGATIVE
Resp Syncytial Virus by PCR: NEGATIVE
SARS Coronavirus 2 by RT PCR: NEGATIVE

## 2023-12-26 LAB — CBC
HCT: 51.9 % (ref 39.0–52.0)
Hemoglobin: 17.2 g/dL — ABNORMAL HIGH (ref 13.0–17.0)
MCH: 28.7 pg (ref 26.0–34.0)
MCHC: 33.1 g/dL (ref 30.0–36.0)
MCV: 86.5 fL (ref 80.0–100.0)
Platelets: 330 K/uL (ref 150–400)
RBC: 6 MIL/uL — ABNORMAL HIGH (ref 4.22–5.81)
RDW: 12.8 % (ref 11.5–15.5)
WBC: 23.3 K/uL — ABNORMAL HIGH (ref 4.0–10.5)
nRBC: 0 % (ref 0.0–0.2)

## 2023-12-26 LAB — COMPREHENSIVE METABOLIC PANEL WITH GFR
ALT: 15 U/L (ref 0–44)
AST: 20 U/L (ref 15–41)
Albumin: 4.9 g/dL (ref 3.5–5.0)
Alkaline Phosphatase: 145 U/L — ABNORMAL HIGH (ref 38–126)
Anion gap: 13 (ref 5–15)
BUN: 14 mg/dL (ref 6–20)
CO2: 21 mmol/L — ABNORMAL LOW (ref 22–32)
Calcium: 10.5 mg/dL — ABNORMAL HIGH (ref 8.9–10.3)
Chloride: 103 mmol/L (ref 98–111)
Creatinine, Ser: 1.09 mg/dL (ref 0.61–1.24)
GFR, Estimated: >60 mL/min (ref >60)
Glucose, Bld: 144 mg/dL — ABNORMAL HIGH (ref 70–99)
Potassium: 4.5 mmol/L (ref 3.5–5.1)
Sodium: 137 mmol/L (ref 135–145)
Total Bilirubin: 0.6 mg/dL (ref 0.0–1.2)
Total Protein: 8.3 g/dL — ABNORMAL HIGH (ref 6.5–8.1)

## 2023-12-26 LAB — URINALYSIS, ROUTINE W REFLEX MICROSCOPIC
Bacteria, UA: NONE SEEN
Bilirubin Urine: NEGATIVE
Glucose, UA: NEGATIVE mg/dL
Ketones, ur: NEGATIVE mg/dL
Leukocytes,Ua: NEGATIVE
Nitrite: NEGATIVE
Protein, ur: 100 mg/dL — AB
Specific Gravity, Urine: 1.031 — ABNORMAL HIGH (ref 1.005–1.030)
Squamous Epithelial / HPF: 0 /HPF (ref 0–5)
pH: 5 (ref 5.0–8.0)

## 2023-12-26 LAB — LIPASE, BLOOD: Lipase: 16 U/L (ref 11–51)

## 2023-12-26 MED ORDER — SODIUM CHLORIDE 0.9 % IV BOLUS
1000.0000 mL | Freq: Once | INTRAVENOUS | Status: AC
  Administered 2023-12-26: 1000 mL via INTRAVENOUS

## 2023-12-26 MED ORDER — ONDANSETRON HCL 4 MG/2ML IJ SOLN
4.0000 mg | Freq: Once | INTRAMUSCULAR | Status: AC
  Administered 2023-12-26: 4 mg via INTRAVENOUS
  Filled 2023-12-26: qty 2

## 2023-12-26 MED ORDER — ONDANSETRON 4 MG PO TBDP: 4.0000 mg | ORAL_TABLET | Freq: Three times a day (TID) | ORAL | 0 refills | Status: AC | PRN

## 2023-12-26 MED ORDER — KETOROLAC TROMETHAMINE 15 MG/ML IJ SOLN
15.0000 mg | Freq: Once | INTRAMUSCULAR | Status: AC
  Administered 2023-12-26: 15 mg via INTRAVENOUS
  Filled 2023-12-26: qty 1

## 2023-12-26 NOTE — ED Provider Notes (Signed)
 [facility] Provider Note    Event Date/Time   First MD Initiated Contact with Patient 12/26/23 1201     (approximate)   History   Emesis and Diarrhea  Pt c/o vomiting and diarrhea since 5am. Pt reports his gf had the same sx last week.    HPI Shane Griffith is a 33 y.o. male PMH seizures presents for evaluation of vomiting, diarrhea - Patient states he woke up this morning around 5 AM and vomited multiple times and has also had multiple episodes of diarrhea - Vomiting is nonbloody, nonbilious - No abdominal surgical history - No black or bloody stools - States his girlfriend had similar symptoms last week - Ate leftover ham from Thanksgiving last night - Has developed some mild left lateral abdominal discomfort after multiple episodes of vomiting, no other significant abdominal discomfort.  No urinary symptoms.  No testicular pain or swelling. - No cough, congestion, fevers - Uses marijuana daily, last use was yesterday evening       Physical Exam   Triage Vital Signs: ED Triage Vitals [12/26/23 1138]  Encounter Vitals Group     BP 130/89     Girls Systolic BP Percentile      Girls Diastolic BP Percentile      Boys Systolic BP Percentile      Boys Diastolic BP Percentile      Pulse Rate 100     Resp 20     Temp 97.9 F (36.6 C)     Temp src      SpO2 100 %     Weight 210 lb (95.3 kg)     Height 6' 1 (1.854 m)     Head Circumference      Peak Flow      Pain Score      Pain Loc      Pain Education      Exclude from Growth Chart     Most recent vital signs: Vitals:   12/26/23 1138  BP: 130/89  Pulse: 100  Resp: 20  Temp: 97.9 F (36.6 C)  SpO2: 100%     General: Awake, no distress.  CV:  Good peripheral perfusion. RRR, RP 2+ Resp:  Normal effort. CTAB Abd:  No distention. Nontender to deep palpation throughout   ED Results / Procedures / Treatments   Labs (all labs ordered are listed, but only abnormal results  are displayed) Labs Reviewed  COMPREHENSIVE METABOLIC PANEL WITH GFR - Abnormal; Notable for the following components:      Result Value   CO2 21 (*)    Glucose, Bld 144 (*)    Calcium 10.5 (*)    Total Protein 8.3 (*)    Alkaline Phosphatase 145 (*)    All other components within normal limits  CBC - Abnormal; Notable for the following components:   WBC 23.3 (*)    RBC 6.00 (*)    Hemoglobin 17.2 (*)    All other components within normal limits  URINALYSIS, ROUTINE W REFLEX MICROSCOPIC - Abnormal; Notable for the following components:   Color, Urine YELLOW (*)    APPearance TURBID (*)    Specific Gravity, Urine 1.031 (*)    Hgb urine dipstick MODERATE (*)    Protein, ur 100 (*)    All other components within normal limits  RESP PANEL BY RT-PCR (RSV, FLU A&B, COVID)  RVPGX2  LIPASE, BLOOD     EKG  N/a   RADIOLOGY N/a  PROCEDURES:  Critical Care performed: No  Procedures   MEDICATIONS ORDERED IN ED: Medications  sodium chloride  0.9 % bolus 1,000 mL (0 mLs Intravenous Stopped 12/26/23 1400)  ondansetron  (ZOFRAN ) injection 4 mg (4 mg Intravenous Given 12/26/23 1233)  ketorolac  (TORADOL ) 15 MG/ML injection 15 mg (15 mg Intravenous Given 12/26/23 1235)     IMPRESSION / MDM / ASSESSMENT AND PLAN / ED COURSE  I reviewed the triage vital signs and the nursing notes.                              DDX/MDM/AP: Differential diagnosis includes, but is not limited to, gastroenteritis/viral syndrome, cannabis hyperemesis, food poisoning, doubt acute intra-abdominal pathology at this time including appendicitis, diverticulitis, pancreatitis, bowel obstruction.  Plan: - Labs - IV fluid - Zofran , Toradol  - No indication for advanced abdominal imaging and initial eval, will reassess  Patient's presentation is most consistent with acute presentation with potential threat to life or bodily function.  ED course below.  Workup notable for leukocytosis low 20s, suspect  stress response from nausea and vomiting and diarrhea, likely gastroenteritis.  Serial abdominal exam very reassuring with no tenderness to deep palpation throughout.  Chronic mild hematuria, do not suspect underlying urolithiasis at this time.  Tolerating good p.o. intake after single round fluid and IV Zofran .  Viral swab negative.  Suspect food poisoning or other viral illness, counseled not to eat the leftover ham which he ate last night.  No systemic symptoms, remains afebrile here.  Very well-appearing at time of discharge with plan for close PMD follow-up.  ED return precautions in place.  Patient agrees with plan.  Also counseled on marijuana reduction/cessation as this may well contributed to his symptoms today.  Clinical Course as of 12/26/23 1446  Sun Dec 26, 2023  1258 CBC with leukocytosis, suspect stress response from GI illness, also evidence of hemoconcentration [MM]  1259 CMP reviewed, overall reassuring, mildly low bicarb [MM]  1321 Viral swab neg [MM]  1323 Reevaluated, resting comfortably in bed.  No tenderness to deep palpation throughout abdomen and flanks.  No further vomiting or diarrhea.  Will trial p.o. [MM]  1400 Patient reevaluated, tolerated full cup of Gatorade without any vomiting.  Repeat abdominal exam with no tenderness to deep palpation throughout.  Will provide urine sample.  Clinically doubt urolithiasis or UTI at this time. [MM]  1440 Urinalysis with hematuria, no evidence of infection.  Per chart review, chronically has hematuria, has been seen by urology for this and felt not to warrant further workup at that time.  History of known nephrolithiasis though no urolithiasis.  Patient remains asymptomatic here.  No flank or abdominal pain on serial reevaluations.  No urinary symptoms.  Do not clinically suspect urolithiasis.  Presentation most consistent with gastroenteritis/food poisoning or possible cannabis hyperemesis.  Stable for outpatient follow-up.  Plan for  PMD follow-up.  ED return precautions in place.  Patient agrees with plan. [MM]    Clinical Course User Index [MM] Clarine Ozell LABOR, MD     FINAL CLINICAL IMPRESSION(S) / ED DIAGNOSES   Final diagnoses:  Nausea and vomiting, unspecified vomiting type  Diarrhea, unspecified type     Rx / DC Orders   ED Discharge Orders          Ordered    ondansetron  (ZOFRAN -ODT) 4 MG disintegrating tablet  Every 8 hours PRN        12/26/23 1428  Note:  This document was prepared using Dragon voice recognition software and may include unintentional dictation errors.   Clarine Ozell LABOR, MD 12/26/23 [phone]

## 2023-12-26 NOTE — Discharge Instructions (Addendum)
 Your evaluation in the emergency department was overall reassuring.  I suspect you have a viral illness or food poisoning, and this should resolve in the next 1-2 days.  I prescribed you nausea medication to use as needed.  Continue to drink plenty of fluids to avoid dehydration.  Please do follow-up with your primary care provider for reevaluation, and return to the emergency department with any new or worsening symptoms.

## 2023-12-26 NOTE — ED Triage Notes (Signed)
 Pt c/o vomiting and diarrhea since 5am. Pt reports his gf had the same sx last week.
